# Patient Record
Sex: Male | Born: 1960
Health system: Southern US, Community
[De-identification: ages and names within clinical notes are randomized; demographics above are authoritative.]

---

## 2017-05-15 ENCOUNTER — Inpatient Hospital Stay
Admission: RE | Admit: 2017-05-15 | Discharge: 2017-06-24 | Disposition: A | Discharge status: Home / Self Care | Payer: Medicaid Other | Source: Home / Self Care | Attending: Internal Medicine | Admitting: Internal Medicine

## 2017-05-15 DIAGNOSIS — Z09 Encounter for follow-up examination after completed treatment for conditions other than malignant neoplasm: Secondary | ICD-10-CM

## 2017-05-15 DIAGNOSIS — Z0189 Encounter for other specified special examinations: Secondary | ICD-10-CM

## 2017-05-15 DIAGNOSIS — J189 Pneumonia, unspecified organism: Secondary | ICD-10-CM

## 2017-05-15 DIAGNOSIS — Z992 Dependence on renal dialysis: Secondary | ICD-10-CM

## 2017-05-15 DIAGNOSIS — Z978 Presence of other specified devices: Secondary | ICD-10-CM

## 2017-05-15 DIAGNOSIS — J9690 Respiratory failure, unspecified, unspecified whether with hypoxia or hypercapnia: Secondary | ICD-10-CM

## 2017-05-16 ENCOUNTER — Other Ambulatory Visit (HOSPITAL_COMMUNITY): Payer: Medicaid Other

## 2017-05-16 LAB — COMPREHENSIVE METABOLIC PANEL
ALBUMIN: 1.2 g/dL — AB (ref 3.5–5.0)
ALK PHOS: 501 U/L — AB (ref 38–126)
ALK PHOS: 950 U/L — AB (ref 38–126)
ALT: 100 U/L — AB (ref 17–63)
ALT: 216 U/L — ABNORMAL HIGH (ref 17–63)
ANION GAP: 13 (ref 5–15)
AST: 189 U/L — AB (ref 15–41)
AST: 691 U/L — ABNORMAL HIGH (ref 15–41)
Albumin: 1.2 g/dL — ABNORMAL LOW (ref 3.5–5.0)
Anion gap: 13 (ref 5–15)
BILIRUBIN TOTAL: 1.5 mg/dL — AB (ref 0.3–1.2)
BUN: 68 mg/dL — AB (ref 6–20)
BUN: 76 mg/dL — ABNORMAL HIGH (ref 6–20)
CALCIUM: 7.5 mg/dL — AB (ref 8.9–10.3)
CO2: 25 mmol/L (ref 22–32)
CO2: 25 mmol/L (ref 22–32)
CREATININE: 4.51 mg/dL — AB (ref 0.61–1.24)
Calcium: 7.5 mg/dL — ABNORMAL LOW (ref 8.9–10.3)
Chloride: 97 mmol/L — ABNORMAL LOW (ref 101–111)
Chloride: 97 mmol/L — ABNORMAL LOW (ref 101–111)
Creatinine, Ser: 5.41 mg/dL — ABNORMAL HIGH (ref 0.61–1.24)
GFR calc non Af Amer: 14 mL/min — ABNORMAL LOW (ref >60)
GFR calc non Af Amer: 11 mL/min — ABNORMAL LOW (ref >60)
GFR, EST AFRICAN AMERICAN: 13 mL/min — AB (ref >60)
GFR, EST AFRICAN AMERICAN: 16 mL/min — AB (ref >60)
GLUCOSE: 64 mg/dL — AB (ref 65–99)
Glucose, Bld: 294 mg/dL — ABNORMAL HIGH (ref 65–99)
Potassium: 3.9 mmol/L (ref 3.5–5.1)
Potassium: 4.2 mmol/L (ref 3.5–5.1)
SODIUM: 135 mmol/L (ref 135–145)
SODIUM: 135 mmol/L (ref 135–145)
TOTAL PROTEIN: 6 g/dL — AB (ref 6.5–8.1)
Total Bilirubin: 1 mg/dL (ref 0.3–1.2)
Total Protein: 6 g/dL — ABNORMAL LOW (ref 6.5–8.1)

## 2017-05-16 LAB — CBC WITH DIFFERENTIAL/PLATELET
BASOS PCT: 0 %
Basophils Absolute: 0 10*3/uL (ref 0.0–0.1)
EOS ABS: 0 10*3/uL (ref 0.0–0.7)
EOS PCT: 0 %
HEMATOCRIT: 24.1 % — AB (ref 39.0–52.0)
Hemoglobin: 7.8 g/dL — ABNORMAL LOW (ref 13.0–17.0)
LYMPHS ABS: 1.6 10*3/uL (ref 0.7–4.0)
Lymphocytes Relative: 8 %
MCH: 28.6 pg (ref 26.0–34.0)
MCHC: 32.4 g/dL (ref 30.0–36.0)
MCV: 88.3 fL (ref 78.0–100.0)
MONO ABS: 1 10*3/uL (ref 0.1–1.0)
Monocytes Relative: 5 %
NEUTROS ABS: 17.5 10*3/uL — AB (ref 1.7–7.7)
NEUTROS PCT: 87 %
PLATELETS: 65 10*3/uL — AB (ref 150–400)
RBC: 2.73 MIL/uL — ABNORMAL LOW (ref 4.22–5.81)
RDW: 14.3 % (ref 11.5–15.5)
WBC: 20.1 10*3/uL — ABNORMAL HIGH (ref 4.0–10.5)

## 2017-05-16 LAB — RENAL FUNCTION PANEL
ALBUMIN: 1.1 g/dL — AB (ref 3.5–5.0)
ANION GAP: 15 (ref 5–15)
BUN: 78 mg/dL — ABNORMAL HIGH (ref 6–20)
CALCIUM: 7.5 mg/dL — AB (ref 8.9–10.3)
CO2: 22 mmol/L (ref 22–32)
Chloride: 98 mmol/L — ABNORMAL LOW (ref 101–111)
Creatinine, Ser: 5.2 mg/dL — ABNORMAL HIGH (ref 0.61–1.24)
GFR, EST AFRICAN AMERICAN: 13 mL/min — AB (ref >60)
GFR, EST NON AFRICAN AMERICAN: 11 mL/min — AB (ref >60)
GLUCOSE: 152 mg/dL — AB (ref 65–99)
PHOSPHORUS: 5.9 mg/dL — AB (ref 2.5–4.6)
Potassium: 5.3 mmol/L — ABNORMAL HIGH (ref 3.5–5.1)
SODIUM: 135 mmol/L (ref 135–145)

## 2017-05-16 LAB — BLOOD GAS, ARTERIAL
ACID-BASE EXCESS: 5.3 mmol/L — AB (ref 0.0–2.0)
Acid-Base Excess: 3.9 mmol/L — ABNORMAL HIGH (ref 0.0–2.0)
Bicarbonate: 28.4 mmol/L — ABNORMAL HIGH (ref 20.0–28.0)
Bicarbonate: 27.6 mmol/L (ref 20.0–28.0)
FIO2: 28
FIO2: 40
MECHVT: 450 mL
O2 SAT: 92.7 %
O2 Saturation: 90.9 %
PATIENT TEMPERATURE: 98.6
PCO2 ART: 35.3 mmHg (ref 32.0–48.0)
PEEP/CPAP: 5 cmH2O
PEEP: 5 cmH2O
PH ART: 7.516 — AB (ref 7.350–7.450)
PO2 ART: 62.6 mmHg — AB (ref 83.0–108.0)
Patient temperature: 98.6
RATE: 10 resp/min
RATE: 10 resp/min
VT: 450 mL
pCO2 arterial: 39.1 mmHg (ref 32.0–48.0)
pH, Arterial: 7.462 — ABNORMAL HIGH (ref 7.350–7.450)
pO2, Arterial: 63.6 mmHg — ABNORMAL LOW (ref 83.0–108.0)

## 2017-05-16 LAB — CBC
HCT: 24.1 % — ABNORMAL LOW (ref 39.0–52.0)
HCT: 25.6 % — ABNORMAL LOW (ref 39.0–52.0)
HEMOGLOBIN: 7.9 g/dL — AB (ref 13.0–17.0)
HEMOGLOBIN: 8.4 g/dL — AB (ref 13.0–17.0)
MCH: 28.9 pg (ref 26.0–34.0)
MCH: 29.5 pg (ref 26.0–34.0)
MCHC: 32.8 g/dL (ref 30.0–36.0)
MCHC: 32.8 g/dL (ref 30.0–36.0)
MCV: 88.3 fL (ref 78.0–100.0)
MCV: 89.8 fL (ref 78.0–100.0)
Platelets: 86 10*3/uL — ABNORMAL LOW (ref 150–400)
Platelets: 129 10*3/uL — ABNORMAL LOW (ref 150–400)
RBC: 2.73 MIL/uL — AB (ref 4.22–5.81)
RBC: 2.85 MIL/uL — ABNORMAL LOW (ref 4.22–5.81)
RDW: 14.5 % (ref 11.5–15.5)
RDW: 14.7 % (ref 11.5–15.5)
WBC: 23.8 10*3/uL — AB (ref 4.0–10.5)
WBC: 25.5 10*3/uL — ABNORMAL HIGH (ref 4.0–10.5)

## 2017-05-16 LAB — HEMOGLOBIN A1C
Hgb A1c MFr Bld: 7.3 % — ABNORMAL HIGH (ref 4.8–5.6)
MEAN PLASMA GLUCOSE: 162.81 mg/dL

## 2017-05-16 LAB — TSH
TSH: 5.675 u[IU]/mL — ABNORMAL HIGH (ref 0.350–4.500)
TSH: 4.201 u[IU]/mL (ref 0.350–4.500)

## 2017-05-16 NOTE — Consult Note (Signed)
[facility] CONSULT NOTE    Date: 05/16/2017                  Patient Name:  Nathan Branch  MRN: [id number]  DOB: [birth year]  Age / Sex: 57 y.o., male         PCP: No primary care provider on file.                 Service Requesting Consult: Hospitalist                 Reason for Consult: Acute renal failure requiring dialysis            History of Present Illness: Patient is a 57 y.o. male with a PMHx of diabetes mellitus type 2, schizophrenia, bipolar disorder, hyperlipidemia, hypertension, who was admitted to [facility] on 05/15/2017 for ongoing treatment of acute respiratory failure secondary to bilateral pneumonia as well as acute renal failure requiring hemodialysis.  She was admitted to outside hospital from May 09, 2017 to May 15, 2017.  Originally he came with a witnessed cardiopulmonary arrest.  The cause of this appeared to be community-acquired pneumonia leading to acute respiratory failure.  Patient was initially admitted to ICU at the outside hospital.  He also had evidence of septic shock.  He was initially weaned from the ventilator yesterday but had to be placed back on.  In addition his renal function deteriorated and they started him on hemodialysis.  His renal function remains quite poor with a BUN of 68 and creatinine of 4.51.  She will has an elevated white count of 20.1.   Medications:  Medications upon discharge from outside hospital: Meropenem 1 g IV every 24 hours, valproic acid 250 mg twice daily, Lantus 15 units nightly, Haldol 2 mg intramuscular nightly, Pepcid 20 mg daily, Levophed drip.   Allergies: Aspirin   Past Medical History: diabetes mellitus type 2, schizophrenia, bipolar disorder, hyperlipidemia, hypertension,  Past Surgical History: Right femoral temporary dialysis catheter placement  Family History: Unable to obtain from the patient as he is on the ventilator.  Social History: Unable to obtain from  the patient as he is on the ventilator.  Review of Systems: Unable to obtain from the patient as he is on the ventilator.  Vital Signs: Pulse 93 respirations 24 blood pressure 101/57 Weight trends: There were no vitals filed for this visit.  Physical Exam: General: Critically ill appearing  Head: Normocephalic, atraumatic.  Eyes: Anicteric  Nose: Mucous membranes moist, not inflammed, nonerythematous.  Throat: ETT and OG tube in place  Neck: Supple, trachea midline.  Lungs:  Coarse rhonchi bilateral, vent assisted  Heart: S1S2 no rubs  Abdomen:  BS normoactive. Soft, Nondistended, non-tender.  No masses or organomegaly.  Extremities: No pretibial edema.  Neurologic: Arousable, hands in restraints  Skin: No visible rashes, scars.    Lab results: Basic Metabolic Panel: Recent Labs  Lab 05/16/17 0012  NA 135  K 3.9  CL 97*  CO2 25  GLUCOSE 64*  BUN 68*  CREATININE 4.51*  CALCIUM 7.5*    Liver Function Tests: Recent Labs  Lab 05/16/17 0012  AST 189*  ALT 100*  ALKPHOS 501*  BILITOT 1.0  PROT 6.0*  ALBUMIN 1.2*   No results for input(s): LIPASE, AMYLASE in the last 168 hours. No results for input(s): AMMONIA in the last 168 hours.  CBC: Recent Labs  Lab 05/16/17 0012  WBC 20.1*  NEUTROABS 17.5*  HGB 7.8*  HCT  24.1*  MCV 88.3  PLT 65*    Cardiac Enzymes: No results for input(s): CKTOTAL, CKMB, CKMBINDEX, TROPONINI in the last 168 hours.  BNP: Invalid input(s): POCBNP  CBG: No results for input(s): GLUCAP in the last 168 hours.  Microbiology: No results found for this or any previous visit.  Coagulation Studies: No results for input(s): LABPROT, INR in the last 72 hours.  Urinalysis: No results for input(s): COLORURINE, LABSPEC, PHURINE, GLUCOSEU, HGBUR, BILIRUBINUR, KETONESUR, PROTEINUR, UROBILINOGEN, NITRITE, LEUKOCYTESUR in the last 72 hours.  Invalid input(s): APPERANCEUR    Imaging: Dg Abd Portable 1v  Result Date:  05/16/2017 CLINICAL DATA:  Initial evaluation for nasogastric tube placement. EXAM: PORTABLE ABDOMEN - 1 VIEW COMPARISON:  None. FINDINGS: NG tube in place with tip in side hole overlying the stomach, well beyond the GE junction. Tip projects distally. Bowel gas pattern within normal limits without obstruction or ileus. Right-sided femoral catheter overlies the lower right pelvis. Dense retrocardiac left lower lobe opacity, which may reflect atelectasis or infiltrate. Suspected small left pleural effusion. IMPRESSION: 1. NG tube in place with tip and side hole well beyond the GE junction. Tip projects distally. 2. Dense retrocardiac left lower lobe opacity, which may reflect atelectasis or infiltrate. 3. Small left pleural effusion. Electronically Signed   By: Rise Mu M.D.   On: 05/16/2017 03:00      Assessment & Plan: Pt is a 57 y.o. male with a PMHx of diabetes mellitus type 2, schizophrenia, bipolar disorder, hyperlipidemia, hypertension, who was admitted to [facility] on 05/15/2017 for ongoing treatment of acute respiratory failure secondary to bilateral pneumonia as well as acute renal failure requiring hemodialysis.  She had witnessed cardiopulmonary arrest prior to being brought to outside hospital.  1.  Acute renal failure secondary to septic shock. 2.  Acute respiratory failure currently on the ventilator. 3.  Anemia unspecified. 4.  Hypotension.  Plan: Patient was originally admitted to outside hospital with acute cardiopulmonary arrest.  Hospital course complicated with acute respiratory failure, bilateral pneumonia, and acute renal failure.  His BUN and creatinine remain quite high.  He has a temporary femoral dialysis catheter in place.  Therefore we will proceed with dialysis treatment again today.  We will plan for treatment for 3.5 hours, blood flow rate 400, dialysate flow rate 800, and minimal ultrafiltration.  Will provide for albumin during dialysis for blood  pressure support.  Prognosis guarded at the moment.  Further plan as patient progresses.  Thanks for consultation.

## 2017-05-17 LAB — HEPATITIS B SURFACE ANTIGEN: HEP B S AG: NEGATIVE

## 2017-05-17 LAB — HEPATITIS B CORE ANTIBODY, TOTAL: HEP B C TOTAL AB: NEGATIVE

## 2017-05-17 LAB — C DIFFICILE QUICK SCREEN W PCR REFLEX
C DIFFICLE (CDIFF) ANTIGEN: NEGATIVE
C Diff interpretation: NOT DETECTED
C Diff toxin: NEGATIVE

## 2017-05-17 LAB — HEPATITIS B SURFACE ANTIBODY,QUALITATIVE: HEP B S AB: NONREACTIVE

## 2017-05-19 LAB — RENAL FUNCTION PANEL
ALBUMIN: 1.1 g/dL — AB (ref 3.5–5.0)
Anion gap: 12 (ref 5–15)
BUN: 76 mg/dL — AB (ref 6–20)
CALCIUM: 7.7 mg/dL — AB (ref 8.9–10.3)
CO2: 25 mmol/L (ref 22–32)
CREATININE: 6.29 mg/dL — AB (ref 0.61–1.24)
Chloride: 97 mmol/L — ABNORMAL LOW (ref 101–111)
GFR calc Af Amer: 10 mL/min — ABNORMAL LOW (ref >60)
GFR calc non Af Amer: 9 mL/min — ABNORMAL LOW (ref >60)
GLUCOSE: 187 mg/dL — AB (ref 65–99)
PHOSPHORUS: 6.9 mg/dL — AB (ref 2.5–4.6)
Potassium: 4.1 mmol/L (ref 3.5–5.1)
SODIUM: 134 mmol/L — AB (ref 135–145)

## 2017-05-19 LAB — CBC
HCT: 22.6 % — ABNORMAL LOW (ref 39.0–52.0)
Hemoglobin: 7.4 g/dL — ABNORMAL LOW (ref 13.0–17.0)
MCH: 29.6 pg (ref 26.0–34.0)
MCHC: 32.7 g/dL (ref 30.0–36.0)
MCV: 90.4 fL (ref 78.0–100.0)
Platelets: 344 10*3/uL (ref 150–400)
RBC: 2.5 MIL/uL — ABNORMAL LOW (ref 4.22–5.81)
RDW: 14.9 % (ref 11.5–15.5)
WBC: 16.3 10*3/uL — AB (ref 4.0–10.5)

## 2017-05-19 NOTE — Progress Notes (Signed)
[facility]  ROUNDING NOTE   Subjective:  Patient seen and evaluated during hemodialysis. He remains critically ill and remains on the ventilator. Overall his renal function remains low with a creatinine of 6.29. He also appears to be anemic with a hemoglobin of 7.4.   Objective:  Vital signs in last 24 hours:  Temperature 98.7 pulse 107 respirations 18 blood pressure 120/66 Physical Exam: General: Critically ill appearing  Head: Normocephalic, atraumatic. Moist oral mucosal membranes  Eyes: Anicteric  Neck: Supple, trachea midline  Lungs:  Scattered rhonchi, on ventilator  Heart: S1S2 no rubs  Abdomen:  Soft, nontender, bowel sounds present  Extremities: Trace peripheral edema.  Neurologic: Not following commands  Skin: No lesions  Access: Right femoral dialysis catheter    Basic Metabolic Panel: Recent Labs  Lab 05/16/17 0012 05/16/17 0907 05/16/17 1324 05/19/17 0757  NA 135 135 135 134*  K 3.9 5.3* 4.2 4.1  CL 97* 98* 97* 97*  CO2 25 22 25 25   GLUCOSE 64* 152* 294* 187*  BUN 68* 78* 76* 76*  CREATININE 4.51* 5.20* 5.41* 6.29*  CALCIUM 7.5* 7.5* 7.5* 7.7*  PHOS  --  5.9*  --  6.9*    Liver Function Tests: Recent Labs  Lab 05/16/17 0012 05/16/17 0907 05/16/17 1324 05/19/17 0757  AST 189*  --  691*  --   ALT 100*  --  216*  --   ALKPHOS 501*  --  950*  --   BILITOT 1.0  --  1.5*  --   PROT 6.0*  --  6.0*  --   ALBUMIN 1.2* 1.1* 1.2* 1.1*   No results for input(s): LIPASE, AMYLASE in the last 168 hours. No results for input(s): AMMONIA in the last 168 hours.  CBC: Recent Labs  Lab 05/16/17 0012 05/16/17 0907 05/16/17 1324 05/19/17 0757  WBC 20.1* 23.8* 25.5* 16.3*  NEUTROABS 17.5*  --   --   --   HGB 7.8* 7.9* 8.4* 7.4*  HCT 24.1* 24.1* 25.6* 22.6*  MCV 88.3 88.3 89.8 90.4  PLT 65* 86* 129* 344    Cardiac Enzymes: No results for input(s): CKTOTAL, CKMB, CKMBINDEX, TROPONINI in the last 168 hours.  BNP: Invalid input(s):  POCBNP  CBG: No results for input(s): GLUCAP in the last 168 hours.  Microbiology: Results for orders placed or performed during the hospital encounter of 05/15/17  C difficile quick scan w PCR reflex     Status: None   Collection Time: 05/17/17 12:30 PM  Result Value Ref Range Status   C Diff antigen NEGATIVE NEGATIVE Final   C Diff toxin NEGATIVE NEGATIVE Final   C Diff interpretation No C. difficile detected.  Final    Coagulation Studies: No results for input(s): LABPROT, INR in the last 72 hours.  Urinalysis: No results for input(s): COLORURINE, LABSPEC, PHURINE, GLUCOSEU, HGBUR, BILIRUBINUR, KETONESUR, PROTEINUR, UROBILINOGEN, NITRITE, LEUKOCYTESUR in the last 72 hours.  Invalid input(s): APPERANCEUR    Imaging: No results found.   Medications:       Assessment/ Plan:  57 y.o. male with a PMHx of diabetes mellitus type 2, schizophrenia, bipolar disorder, hyperlipidemia, hypertension, who was admitted to [facility] on 05/15/2017 for ongoing treatment of acute respiratory failure secondary to bilateral pneumonia as well as acute renal failure requiring hemodialysis.  He had witnessed cardiopulmonary arrest prior to being brought to outside hospital.  1.  Acute renal failure secondary to septic shock. 2.  Acute respiratory failure currently on the ventilator. 3.  Anemia unspecified. 4.  Hypotension.  Plan:  Patient remains critically ill at this point in time.  He is seen and evaluated during hemodialysis today and appears to be tolerating well thus far.  Patient's acute respiratory failure is ongoing and he remains on the ventilator at this time.  He will need continue ventilatory support at this time.  Hemoglobin also remains low.  Consider infusion for hemoglobin of 7 or less.  Overall patient has a very guarded prognosis and we will continue to monitor his progress closely.   LOS: 0 Darcy Barbara 1/28/20193:38 PM

## 2017-05-21 LAB — RENAL FUNCTION PANEL
Albumin: 1.3 g/dL — ABNORMAL LOW (ref 3.5–5.0)
Anion gap: 11 (ref 5–15)
BUN: 53 mg/dL — ABNORMAL HIGH (ref 6–20)
CO2: 25 mmol/L (ref 22–32)
Calcium: 7.7 mg/dL — ABNORMAL LOW (ref 8.9–10.3)
Chloride: 99 mmol/L — ABNORMAL LOW (ref 101–111)
Creatinine, Ser: 5.64 mg/dL — ABNORMAL HIGH (ref 0.61–1.24)
GFR calc Af Amer: 12 mL/min — ABNORMAL LOW
GFR calc non Af Amer: 10 mL/min — ABNORMAL LOW
Glucose, Bld: 122 mg/dL — ABNORMAL HIGH (ref 65–99)
Phosphorus: 6.4 mg/dL — ABNORMAL HIGH (ref 2.5–4.6)
Potassium: 4 mmol/L (ref 3.5–5.1)
Sodium: 135 mmol/L (ref 135–145)

## 2017-05-21 LAB — CBC
HCT: 18.7 % — ABNORMAL LOW (ref 39.0–52.0)
Hemoglobin: 6 g/dL — CL (ref 13.0–17.0)
MCH: 29.4 pg (ref 26.0–34.0)
MCHC: 32.1 g/dL (ref 30.0–36.0)
MCV: 91.7 fL (ref 78.0–100.0)
Platelets: 454 10*3/uL — ABNORMAL HIGH (ref 150–400)
RBC: 2.04 MIL/uL — ABNORMAL LOW (ref 4.22–5.81)
RDW: 15.2 % (ref 11.5–15.5)
WBC: 12.4 10*3/uL — ABNORMAL HIGH (ref 4.0–10.5)

## 2017-05-21 LAB — PREPARE RBC (CROSSMATCH)

## 2017-05-21 NOTE — Progress Notes (Signed)
[facility]  ROUNDING NOTE   Subjective:  Patient remains critically ill but is much more awake and alert today.\ Patient due for hemodialysis today. Hemoglobin quite low at 6.0.  Objective:  Vital signs in last 24 hours:  Temperature 98.7 pulse 107 respiration 17 blood pressure 105/59  Physical Exam: General: Critically ill appearing  Head: Normocephalic, atraumatic. Moist oral mucosal membranes  Eyes: Anicteric  Neck: Supple, trachea midline  Lungs:  Scattered rhonchi, on ventilator  Heart: S1S2 no rubs  Abdomen:  Soft, nontender, bowel sounds present  Extremities: Trace peripheral edema.  Neurologic:  Arousable  Skin: No lesions  Access: Right femoral dialysis catheter    Basic Metabolic Panel: Recent Labs  Lab 05/16/17 0012 05/16/17 0907 05/16/17 1324 05/19/17 0757 05/21/17 0539  NA 135 135 135 134* 135  K 3.9 5.3* 4.2 4.1 4.0  CL 97* 98* 97* 97* 99*  CO2 25 22 25 25 25   GLUCOSE 64* 152* 294* 187* 122*  BUN 68* 78* 76* 76* 53*  CREATININE 4.51* 5.20* 5.41* 6.29* 5.64*  CALCIUM 7.5* 7.5* 7.5* 7.7* 7.7*  PHOS  --  5.9*  --  6.9* 6.4*    Liver Function Tests: Recent Labs  Lab 05/16/17 0012 05/16/17 0907 05/16/17 1324 05/19/17 0757 05/21/17 0539  AST 189*  --  691*  --   --   ALT 100*  --  216*  --   --   ALKPHOS 501*  --  950*  --   --   BILITOT 1.0  --  1.5*  --   --   PROT 6.0*  --  6.0*  --   --   ALBUMIN 1.2* 1.1* 1.2* 1.1* 1.3*   No results for input(s): LIPASE, AMYLASE in the last 168 hours. No results for input(s): AMMONIA in the last 168 hours.  CBC: Recent Labs  Lab 05/16/17 0012 05/16/17 0907 05/16/17 1324 05/19/17 0757 05/21/17 0539  WBC 20.1* 23.8* 25.5* 16.3* 12.4*  NEUTROABS 17.5*  --   --   --   --   HGB 7.8* 7.9* 8.4* 7.4* 6.0*  HCT 24.1* 24.1* 25.6* 22.6* 18.7*  MCV 88.3 88.3 89.8 90.4 91.7  PLT 65* 86* 129* 344 454*    Cardiac Enzymes: No results for input(s): CKTOTAL, CKMB, CKMBINDEX, TROPONINI in the  last 168 hours.  BNP: Invalid input(s): POCBNP  CBG: No results for input(s): GLUCAP in the last 168 hours.  Microbiology: Results for orders placed or performed during the hospital encounter of 05/15/17  C difficile quick scan w PCR reflex     Status: None   Collection Time: 05/17/17 12:30 PM  Result Value Ref Range Status   C Diff antigen NEGATIVE NEGATIVE Final   C Diff toxin NEGATIVE NEGATIVE Final   C Diff interpretation No C. difficile detected.  Final    Coagulation Studies: No results for input(s): LABPROT, INR in the last 72 hours.  Urinalysis: No results for input(s): COLORURINE, LABSPEC, PHURINE, GLUCOSEU, HGBUR, BILIRUBINUR, KETONESUR, PROTEINUR, UROBILINOGEN, NITRITE, LEUKOCYTESUR in the last 72 hours.  Invalid input(s): APPERANCEUR    Imaging: No results found.   Medications:       Assessment/ Plan:  57 y.o. male with a PMHx of diabetes mellitus type 2, schizophrenia, bipolar disorder, hyperlipidemia, hypertension, who was admitted to [facility] on 05/15/2017 for ongoing treatment of acute respiratory failure secondary to bilateral pneumonia as well as acute renal failure requiring hemodialysis.  He had witnessed cardiopulmonary arrest prior to being brought to  outside hospital.  1.  Acute renal failure secondary to septic shock. 2.  Acute respiratory failure currently on the ventilator. 3.  Anemia unspecified. 4.  Hypotension.  Plan:  Patient seen at bedside.  Still remains critically ill overall.  Creatinine remains high at 5.64.  Therefore we will continue hemodialysis services.  Hemoglobin quite low at 6.0.  Recommend blood transfusion today but defer this to hospitalist.  Prognosis remains guarded.  We will continue to monitor his status.   LOS: 0 Haylee Mcanany 1/30/20194:24 PM

## 2017-05-22 LAB — CBC
HEMATOCRIT: 20.4 % — AB (ref 39.0–52.0)
Hemoglobin: 6.8 g/dL — CL (ref 13.0–17.0)
MCH: 30 pg (ref 26.0–34.0)
MCHC: 33.3 g/dL (ref 30.0–36.0)
MCV: 89.9 fL (ref 78.0–100.0)
Platelets: 467 10*3/uL — ABNORMAL HIGH (ref 150–400)
RBC: 2.27 MIL/uL — ABNORMAL LOW (ref 4.22–5.81)
RDW: 15.8 % — ABNORMAL HIGH (ref 11.5–15.5)
WBC: 11.6 10*3/uL — ABNORMAL HIGH (ref 4.0–10.5)

## 2017-05-23 LAB — CBC
HEMATOCRIT: 22.9 % — AB (ref 39.0–52.0)
Hemoglobin: 7.3 g/dL — ABNORMAL LOW (ref 13.0–17.0)
MCH: 29 pg (ref 26.0–34.0)
MCHC: 31.9 g/dL (ref 30.0–36.0)
MCV: 90.9 fL (ref 78.0–100.0)
Platelets: 533 10*3/uL — ABNORMAL HIGH (ref 150–400)
RBC: 2.52 MIL/uL — AB (ref 4.22–5.81)
RDW: 15.7 % — ABNORMAL HIGH (ref 11.5–15.5)
WBC: 10.3 10*3/uL (ref 4.0–10.5)

## 2017-05-23 LAB — RENAL FUNCTION PANEL
Albumin: 1.2 g/dL — ABNORMAL LOW (ref 3.5–5.0)
Anion gap: 9 (ref 5–15)
BUN: 46 mg/dL — AB (ref 6–20)
CHLORIDE: 97 mmol/L — AB (ref 101–111)
CO2: 28 mmol/L (ref 22–32)
Calcium: 7.9 mg/dL — ABNORMAL LOW (ref 8.9–10.3)
Creatinine, Ser: 5.38 mg/dL — ABNORMAL HIGH (ref 0.61–1.24)
GFR, EST AFRICAN AMERICAN: 13 mL/min — AB (ref >60)
GFR, EST NON AFRICAN AMERICAN: 11 mL/min — AB (ref >60)
Glucose, Bld: 97 mg/dL (ref 65–99)
POTASSIUM: 3.9 mmol/L (ref 3.5–5.1)
Phosphorus: 6.5 mg/dL — ABNORMAL HIGH (ref 2.5–4.6)
Sodium: 134 mmol/L — ABNORMAL LOW (ref 135–145)

## 2017-05-23 LAB — BLOOD GAS, ARTERIAL
ACID-BASE EXCESS: 5.7 mmol/L — AB (ref 0.0–2.0)
BICARBONATE: 29.5 mmol/L — AB (ref 20.0–28.0)
FIO2: 28
O2 SAT: 98.2 %
PATIENT TEMPERATURE: 98.6
PCO2 ART: 41.6 mmHg (ref 32.0–48.0)
PEEP: 5 cmH2O
PH ART: 7.465 — AB (ref 7.350–7.450)
Pressure support: 12 cmH2O
pO2, Arterial: 103 mmHg (ref 83.0–108.0)

## 2017-05-23 LAB — PREPARE RBC (CROSSMATCH)

## 2017-05-23 NOTE — Progress Notes (Signed)
[facility]  ROUNDING NOTE   Subjective:  Patient seen and evaluated at bedside. He will be due for hemodialysis again today. He remains anemic with hemoglobin of 6.8.  Objective:  Vital signs in last 24 hours:  Temperature 97.2 pulse 95 respirations 18 blood pressure 136/72  Physical Exam: General: Critically ill appearing  Head: ETT/OG in place  Eyes: Anicteric  Neck: Supple, trachea midline  Lungs:  Scattered rhonchi, on ventilator  Heart: S1S2 no rubs  Abdomen:  Soft, nontender, bowel sounds present  Extremities: Trace peripheral edema.  Neurologic: Arousable  Skin: No lesions  Access: Right femoral dialysis catheter    Basic Metabolic Panel: Recent Labs  Lab 05/16/17 0907 05/16/17 1324 05/19/17 0757 05/21/17 0539  NA 135 135 134* 135  K 5.3* 4.2 4.1 4.0  CL 98* 97* 97* 99*  CO2 22 25 25 25   GLUCOSE 152* 294* 187* 122*  BUN 78* 76* 76* 53*  CREATININE 5.20* 5.41* 6.29* 5.64*  CALCIUM 7.5* 7.5* 7.7* 7.7*  PHOS 5.9*  --  6.9* 6.4*    Liver Function Tests: Recent Labs  Lab 05/16/17 0907 05/16/17 1324 05/19/17 0757 05/21/17 0539  AST  --  691*  --   --   ALT  --  216*  --   --   ALKPHOS  --  950*  --   --   BILITOT  --  1.5*  --   --   PROT  --  6.0*  --   --   ALBUMIN 1.1* 1.2* 1.1* 1.3*   No results for input(s): LIPASE, AMYLASE in the last 168 hours. No results for input(s): AMMONIA in the last 168 hours.  CBC: Recent Labs  Lab 05/16/17 0907 05/16/17 1324 05/19/17 0757 05/21/17 0539 05/22/17 1148  WBC 23.8* 25.5* 16.3* 12.4* 11.6*  HGB 7.9* 8.4* 7.4* 6.0* 6.8*  HCT 24.1* 25.6* 22.6* 18.7* 20.4*  MCV 88.3 89.8 90.4 91.7 89.9  PLT 86* 129* 344 454* 467*    Cardiac Enzymes: No results for input(s): CKTOTAL, CKMB, CKMBINDEX, TROPONINI in the last 168 hours.  BNP: Invalid input(s): POCBNP  CBG: No results for input(s): GLUCAP in the last 168 hours.  Microbiology: Results for orders placed or performed during the  hospital encounter of 05/15/17  C difficile quick scan w PCR reflex     Status: None   Collection Time: 05/17/17 12:30 PM  Result Value Ref Range Status   C Diff antigen NEGATIVE NEGATIVE Final   C Diff toxin NEGATIVE NEGATIVE Final   C Diff interpretation No C. difficile detected.  Final    Coagulation Studies: No results for input(s): LABPROT, INR in the last 72 hours.  Urinalysis: No results for input(s): COLORURINE, LABSPEC, PHURINE, GLUCOSEU, HGBUR, BILIRUBINUR, KETONESUR, PROTEINUR, UROBILINOGEN, NITRITE, LEUKOCYTESUR in the last 72 hours.  Invalid input(s): APPERANCEUR    Imaging: No results found.   Medications:       Assessment/ Plan:  57 y.o. male with a PMHx of diabetes mellitus type 2, schizophrenia, bipolar disorder, hyperlipidemia, hypertension, who was admitted to [facility] on 05/15/2017 for ongoing treatment of acute respiratory failure secondary to bilateral pneumonia as well as acute renal failure requiring hemodialysis.  He had witnessed cardiopulmonary arrest prior to being brought to outside hospital.  1.  Acute renal failure secondary to septic shock. 2.  Acute respiratory failure currently on the ventilator. 3.  Anemia unspecified. 4.  Hypotension.  Plan:  Critical illness persist.  He continues to have respiratory failure  and has failed multiple weaning trials.  He is being considered for tracheostomy.  Renal function also remains quite low with an EGFR of 12.  We will plan for hemodialysis again today.  If renal failure persist we would likely need to place a PermCath in the relative near future as well.  Hemoglobin currently 6.8.  Recommend blood transfusion but defer this to the hospitalist.  Overall prognosis remains guarded.  LOS: 0 Nathan Branch 2/1/20198:41 AM

## 2017-05-24 ENCOUNTER — Other Ambulatory Visit (HOSPITAL_COMMUNITY): Payer: Medicaid Other

## 2017-05-25 ENCOUNTER — Other Ambulatory Visit (HOSPITAL_COMMUNITY): Payer: Medicaid Other

## 2017-05-25 LAB — TYPE AND SCREEN
ABO/RH(D): A POS
ANTIBODY SCREEN: NEGATIVE
DAT, IgG: NEGATIVE
PT AG TYPE: POSITIVE
UNIT DIVISION: 0
UNIT DIVISION: 0
UNIT DIVISION: 0

## 2017-05-25 LAB — BPAM RBC
BLOOD PRODUCT EXPIRATION DATE: 201902172359
Blood Product Expiration Date: 201902152359
Blood Product Expiration Date: 201902162359
ISSUE DATE / TIME: 201901302237
ISSUE DATE / TIME: 201901211813
ISSUE DATE / TIME: 201901211813
UNIT TYPE AND RH: 5100
UNIT TYPE AND RH: 5100
Unit Type and Rh: 5100

## 2017-05-26 ENCOUNTER — Other Ambulatory Visit (HOSPITAL_COMMUNITY): Payer: Medicaid Other

## 2017-05-26 LAB — RENAL FUNCTION PANEL
ANION GAP: 12 (ref 5–15)
Albumin: 1.5 g/dL — ABNORMAL LOW (ref 3.5–5.0)
BUN: 54 mg/dL — ABNORMAL HIGH (ref 6–20)
CALCIUM: 8.3 mg/dL — AB (ref 8.9–10.3)
CO2: 26 mmol/L (ref 22–32)
Chloride: 95 mmol/L — ABNORMAL LOW (ref 101–111)
Creatinine, Ser: 6.6 mg/dL — ABNORMAL HIGH (ref 0.61–1.24)
GFR calc non Af Amer: 9 mL/min — ABNORMAL LOW (ref >60)
GFR, EST AFRICAN AMERICAN: 10 mL/min — AB (ref >60)
GLUCOSE: 72 mg/dL (ref 65–99)
Phosphorus: 8 mg/dL — ABNORMAL HIGH (ref 2.5–4.6)
Potassium: 3.4 mmol/L — ABNORMAL LOW (ref 3.5–5.1)
SODIUM: 133 mmol/L — AB (ref 135–145)

## 2017-05-26 LAB — CBC
HCT: 24.6 % — ABNORMAL LOW (ref 39.0–52.0)
HEMOGLOBIN: 7.9 g/dL — AB (ref 13.0–17.0)
MCH: 29.8 pg (ref 26.0–34.0)
MCHC: 32.1 g/dL (ref 30.0–36.0)
MCV: 92.8 fL (ref 78.0–100.0)
PLATELETS: 474 10*3/uL — AB (ref 150–400)
RBC: 2.65 MIL/uL — AB (ref 4.22–5.81)
RDW: 15.7 % — ABNORMAL HIGH (ref 11.5–15.5)
WBC: 8.2 10*3/uL (ref 4.0–10.5)

## 2017-05-26 NOTE — Progress Notes (Signed)
[facility]  ROUNDING NOTE   Subjective:  Patient seen and evaluated during hemodialysis blood flow rate 400. Appears to be tolerating dialysis well.  Objective:  Vital signs in last 24 hours:  Pulse 101 respirations 10 blood pressure 126/83 pulse ox 99%  Physical Exam: General: Critically ill appearing  Head: ETT/OG in place  Eyes: Anicteric  Neck: Supple, trachea midline  Lungs:  Scattered rhonchi, on ventilator  Heart: S1S2 no rubs  Abdomen:  Soft, nontender, bowel sounds present  Extremities: Trace peripheral edema.  Neurologic: Arousable  Skin: No lesions  Access: Right femoral dialysis catheter    Basic Metabolic Panel: Recent Labs  Lab 05/21/17 0539 05/23/17 0738 05/26/17 0753  NA 135 134* 133*  K 4.0 3.9 3.4*  CL 99* 97* 95*  CO2 25 28 26   GLUCOSE 122* 97 72  BUN 53* 46* 54*  CREATININE 5.64* 5.38* 6.60*  CALCIUM 7.7* 7.9* 8.3*  PHOS 6.4* 6.5* 8.0*    Liver Function Tests: Recent Labs  Lab 05/21/17 0539 05/23/17 0738 05/26/17 0753  ALBUMIN 1.3* 1.2* 1.5*   No results for input(s): LIPASE, AMYLASE in the last 168 hours. No results for input(s): AMMONIA in the last 168 hours.  CBC: Recent Labs  Lab 05/21/17 0539 05/22/17 1148 05/23/17 0738 05/26/17 0753  WBC 12.4* 11.6* 10.3 8.2  HGB 6.0* 6.8* 7.3* 7.9*  HCT 18.7* 20.4* 22.9* 24.6*  MCV 91.7 89.9 90.9 92.8  PLT 454* 467* 533* 474*    Cardiac Enzymes: No results for input(s): CKTOTAL, CKMB, CKMBINDEX, TROPONINI in the last 168 hours.  BNP: Invalid input(s): POCBNP  CBG: No results for input(s): GLUCAP in the last 168 hours.  Microbiology: Results for orders placed or performed during the hospital encounter of 05/15/17  C difficile quick scan w PCR reflex     Status: None   Collection Time: 05/17/17 12:30 PM  Result Value Ref Range Status   C Diff antigen NEGATIVE NEGATIVE Final   C Diff toxin NEGATIVE NEGATIVE Final   C Diff interpretation No C. difficile detected.   Final    Coagulation Studies: No results for input(s): LABPROT, INR in the last 72 hours.  Urinalysis: No results for input(s): COLORURINE, LABSPEC, PHURINE, GLUCOSEU, HGBUR, BILIRUBINUR, KETONESUR, PROTEINUR, UROBILINOGEN, NITRITE, LEUKOCYTESUR in the last 72 hours.  Invalid input(s): APPERANCEUR    Imaging: Dg Abd Portable 1v  Result Date: 05/26/2017 CLINICAL DATA:  Abdominal distension, nasogastric suction. EXAM: PORTABLE ABDOMEN - 1 VIEW COMPARISON:  Portable abdominal radiograph of May 25, 2017 and May 16, 2017. FINDINGS: The esophagogastric tube tip and proximal port project in the gastric body. There decreased gaseous distention of small-bowel loops today. There is moderate distention of loops of the ascending and transverse and proximal descending colon similar to that seen previously. No free extraluminal gas collections are observed. IMPRESSION: There has been some interval improvement in the appearance of the small bowel distention. Moderate gaseous distention of the colon persists. Electronically Signed   By: David  SwazilandJordan M.D.   On: 05/26/2017 08:06   Dg Abd Portable 1v  Result Date: 05/25/2017 CLINICAL DATA:  NG tube placement EXAM: PORTABLE ABDOMEN - 1 VIEW COMPARISON:  05/16/2017, chest radiograph 05/24/2017 FINDINGS: Esophageal tube is looped in the stomach, the tip projects over the mid gastric region. Interval increase in gaseous dilatation of small and large bowel. Right femoral catheter projects over the right upper sacrum. IMPRESSION: 1. Esophageal tube is looped within the stomach and the tip projects over the mid  gastric region 2. Interval increase in gaseous enlargement of small and large bowel, findings could be secondary to adynamic ileus versus partial obstruction. Electronically Signed   By: Jasmine Pang M.D.   On: 05/25/2017 18:04     Medications:       Assessment/ Plan:  57 y.o. male with a PMHx of diabetes mellitus type 2, schizophrenia, bipolar  disorder, hyperlipidemia, hypertension, who was admitted to [facility] on 05/15/2017 for ongoing treatment of acute respiratory failure secondary to bilateral pneumonia as well as acute renal failure requiring hemodialysis.  He had witnessed cardiopulmonary arrest prior to being brought to outside hospital.  1.  Acute renal failure secondary to septic shock. 2.  Acute respiratory failure currently on the ventilator. 3.  Anemia unspecified. 4.  Hypotension.  Plan:  Patient seen and evaluated during hemodialysis today.  He appears to be tolerating well thus far.  We will plan for dialysis again on Wednesday.  Patient remains on the ventilator and has proven difficult to wean.  Hemoglobin currently up to 7.9.  We will need to continue to monitor this as well.    LOS: 0 Adream Parzych 2/4/20193:02 PM

## 2017-05-28 LAB — RENAL FUNCTION PANEL
Albumin: 1.4 g/dL — ABNORMAL LOW (ref 3.5–5.0)
Anion gap: 13 (ref 5–15)
BUN: 48 mg/dL — ABNORMAL HIGH (ref 6–20)
CO2: 24 mmol/L (ref 22–32)
Calcium: 8 mg/dL — ABNORMAL LOW (ref 8.9–10.3)
Chloride: 94 mmol/L — ABNORMAL LOW (ref 101–111)
Creatinine, Ser: 6.26 mg/dL — ABNORMAL HIGH (ref 0.61–1.24)
GFR calc Af Amer: 11 mL/min — ABNORMAL LOW (ref >60)
GFR calc non Af Amer: 9 mL/min — ABNORMAL LOW (ref >60)
Glucose, Bld: 78 mg/dL (ref 65–99)
Phosphorus: 7.5 mg/dL — ABNORMAL HIGH (ref 2.5–4.6)
Potassium: 2.6 mmol/L — CL (ref 3.5–5.1)
Sodium: 131 mmol/L — ABNORMAL LOW (ref 135–145)

## 2017-05-28 LAB — CBC
HCT: 20.5 % — ABNORMAL LOW (ref 39.0–52.0)
Hemoglobin: 6.6 g/dL — CL (ref 13.0–17.0)
MCH: 29.2 pg (ref 26.0–34.0)
MCHC: 32.2 g/dL (ref 30.0–36.0)
MCV: 90.7 fL (ref 78.0–100.0)
Platelets: 383 10*3/uL (ref 150–400)
RBC: 2.26 MIL/uL — ABNORMAL LOW (ref 4.22–5.81)
RDW: 15.3 % (ref 11.5–15.5)
WBC: 7.4 10*3/uL (ref 4.0–10.5)

## 2017-05-28 LAB — PREPARE RBC (CROSSMATCH)

## 2017-05-28 NOTE — Progress Notes (Signed)
  [facility]  ROUNDING NOTE   Subjective:  Patient just completed hemodialysis. Ultrafiltration achieved was 1.4 kg. He remains on the ventilator at this time.  Objective:  Vital signs in last 24 hours:  Temperature 96.8 pulse 88 respirations 14 blood pressure 122/66  Physical Exam: General: Critically ill appearing  Head: ETT/OG in place  Eyes: Anicteric  Neck: Supple, trachea midline  Lungs:  Scattered rhonchi, on ventilator  Heart: S1S2 no rubs  Abdomen:  Soft, nontender, bowel sounds present  Extremities: Trace peripheral edema.  Neurologic: Awake and follows simple commands  Skin: No lesions  Access: Right femoral dialysis catheter    Basic Metabolic Panel: Recent Labs  Lab 05/23/17 0738 05/26/17 0753 05/28/17 0630  NA 134* 133* 131*  K 3.9 3.4* 2.6*  CL 97* 95* 94*  CO2 28 26 24   GLUCOSE 97 72 78  BUN 46* 54* 48*  CREATININE 5.38* 6.60* 6.26*  CALCIUM 7.9* 8.3* 8.0*  PHOS 6.5* 8.0* 7.5*    Liver Function Tests: Recent Labs  Lab 05/23/17 0738 05/26/17 0753 05/28/17 0630  ALBUMIN 1.2* 1.5* 1.4*   No results for input(s): LIPASE, AMYLASE in the last 168 hours. No results for input(s): AMMONIA in the last 168 hours.  CBC: Recent Labs  Lab 05/22/17 1148 05/23/17 0738 05/26/17 0753 05/28/17 0630  WBC 11.6* 10.3 8.2 7.4  HGB 6.8* 7.3* 7.9* 6.6*  HCT 20.4* 22.9* 24.6* 20.5*  MCV 89.9 90.9 92.8 90.7  PLT 467* 533* 474* 383    Cardiac Enzymes: No results for input(s): CKTOTAL, CKMB, CKMBINDEX, TROPONINI in the last 168 hours.  BNP: Invalid input(s): POCBNP  CBG: No results for input(s): GLUCAP in the last 168 hours.  Microbiology: Results for orders placed or performed during the hospital encounter of 05/15/17  C difficile quick scan w PCR reflex     Status: None   Collection Time: 05/17/17 12:30 PM  Result Value Ref Range Status   C Diff antigen NEGATIVE NEGATIVE Final   C Diff toxin NEGATIVE NEGATIVE Final   C Diff  interpretation No C. difficile detected.  Final    Coagulation Studies: No results for input(s): LABPROT, INR in the last 72 hours.  Urinalysis: No results for input(s): COLORURINE, LABSPEC, PHURINE, GLUCOSEU, HGBUR, BILIRUBINUR, KETONESUR, PROTEINUR, UROBILINOGEN, NITRITE, LEUKOCYTESUR in the last 72 hours.  Invalid input(s): APPERANCEUR    Imaging: No results found.   Medications:       Assessment/ Plan:  57 y.o. male with a PMHx of diabetes mellitus type 2, schizophrenia, bipolar disorder, hyperlipidemia, hypertension, who was admitted to [facility] on 05/15/2017 for ongoing treatment of acute respiratory failure secondary to bilateral pneumonia as well as acute renal failure requiring hemodialysis.  He had witnessed cardiopulmonary arrest prior to being brought to outside hospital.  1.  Acute renal failure secondary to septic shock. 2.  Acute respiratory failure currently on the ventilator. 3.  Anemia unspecified. 4.  Hypotension.  Plan:  Patient just completed hemodialysis.  He tolerated this well.  Ultrafiltration achieved was 1.4 kg.  His acute renal failure has been rather prolonged.  Patient continues to have acute respiratory failure and remains on the ventilator.  Hopefully tracheostomy is being planned.  Continue to monitor serum electrolytes, CBC, and blood pressure.    LOS: 0 Deva Ron 2/6/20194:16 PM

## 2017-05-30 ENCOUNTER — Encounter (HOSPITAL_COMMUNITY): Payer: Medicaid Other | Admitting: Certified Registered"

## 2017-05-30 ENCOUNTER — Other Ambulatory Visit (HOSPITAL_COMMUNITY): Payer: Medicaid Other

## 2017-05-30 ENCOUNTER — Encounter
Admission: RE | Disposition: A | Discharge status: Home / Self Care | Payer: Self-pay | Source: Home / Self Care | Attending: Internal Medicine

## 2017-05-30 HISTORY — PX: TRACHEOSTOMY TUBE PLACEMENT: SHX814

## 2017-05-30 LAB — CBC
HCT: 26 % — ABNORMAL LOW (ref 39.0–52.0)
HEMOGLOBIN: 8.9 g/dL — AB (ref 13.0–17.0)
MCH: 30.6 pg (ref 26.0–34.0)
MCHC: 34.2 g/dL (ref 30.0–36.0)
MCV: 89.3 fL (ref 78.0–100.0)
PLATELETS: 343 10*3/uL (ref 150–400)
RBC: 2.91 MIL/uL — AB (ref 4.22–5.81)
RDW: 15.2 % (ref 11.5–15.5)
WBC: 11.2 10*3/uL — AB (ref 4.0–10.5)

## 2017-05-30 LAB — RENAL FUNCTION PANEL
Albumin: 1.5 g/dL — ABNORMAL LOW (ref 3.5–5.0)
Anion gap: 12 (ref 5–15)
BUN: 43 mg/dL — ABNORMAL HIGH (ref 6–20)
CALCIUM: 8.2 mg/dL — AB (ref 8.9–10.3)
CO2: 24 mmol/L (ref 22–32)
CREATININE: 5.44 mg/dL — AB (ref 0.61–1.24)
Chloride: 94 mmol/L — ABNORMAL LOW (ref 101–111)
GFR, EST AFRICAN AMERICAN: 12 mL/min — AB (ref >60)
GFR, EST NON AFRICAN AMERICAN: 11 mL/min — AB (ref >60)
Glucose, Bld: 82 mg/dL (ref 65–99)
Phosphorus: 5.2 mg/dL — ABNORMAL HIGH (ref 2.5–4.6)
Potassium: 3 mmol/L — ABNORMAL LOW (ref 3.5–5.1)
SODIUM: 130 mmol/L — AB (ref 135–145)

## 2017-05-30 SURGERY — CREATION, TRACHEOSTOMY
Anesthesia: General

## 2017-05-30 MED ORDER — FENTANYL CITRATE (PF) 250 MCG/5ML IJ SOLN
INTRAMUSCULAR | Status: AC
  Filled 2017-05-30: qty 5

## 2017-05-30 MED ORDER — MIDAZOLAM HCL 2 MG/2ML IJ SOLN
INTRAMUSCULAR | Status: AC
  Filled 2017-05-30: qty 4

## 2017-05-30 MED ORDER — GLYCOPYRROLATE 0.2 MG/ML IJ SOLN
INTRAMUSCULAR | Status: DC | PRN
  Administered 2017-05-30: 0.2 mg via INTRAVENOUS

## 2017-05-30 MED ORDER — ONDANSETRON HCL 4 MG/2ML IJ SOLN
INTRAMUSCULAR | Status: AC
  Filled 2017-05-30: qty 2

## 2017-05-30 MED ORDER — FENTANYL CITRATE (PF) 250 MCG/5ML IJ SOLN
INTRAMUSCULAR | Status: DC | PRN
  Administered 2017-05-30 (×2): 100 ug via INTRAVENOUS

## 2017-05-30 MED ORDER — MIDAZOLAM HCL 2 MG/2ML IJ SOLN
INTRAMUSCULAR | Status: DC | PRN
  Administered 2017-05-30: 2 mg via INTRAVENOUS

## 2017-05-30 MED ORDER — ROCURONIUM BROMIDE 10 MG/ML (PF) SYRINGE
PREFILLED_SYRINGE | INTRAVENOUS | Status: AC
  Filled 2017-05-30: qty 5

## 2017-05-30 MED ORDER — METOPROLOL TARTRATE 5 MG/5ML IV SOLN
INTRAVENOUS | Status: DC | PRN
  Administered 2017-05-30: 1 mg via INTRAVENOUS
  Administered 2017-05-30: 2 mg via INTRAVENOUS

## 2017-05-30 MED ORDER — 0.9 % SODIUM CHLORIDE (POUR BTL) OPTIME
TOPICAL | Status: DC | PRN
  Administered 2017-05-30: 1000 mL

## 2017-05-30 MED ORDER — ONDANSETRON HCL 4 MG/2ML IJ SOLN
INTRAMUSCULAR | Status: DC | PRN
  Administered 2017-05-30: 4 mg via INTRAVENOUS

## 2017-05-30 MED ORDER — NEOSTIGMINE METHYLSULFATE 10 MG/10ML IV SOLN
INTRAVENOUS | Status: DC | PRN
  Administered 2017-05-30: 3 mg via INTRAVENOUS

## 2017-05-30 MED ORDER — LACTATED RINGERS IV SOLN
INTRAVENOUS | Status: DC | PRN
  Administered 2017-05-30: 12:00:00 via INTRAVENOUS

## 2017-05-30 MED ORDER — ROCURONIUM BROMIDE 100 MG/10ML IV SOLN
INTRAVENOUS | Status: DC | PRN
  Administered 2017-05-30: 40 mg via INTRAVENOUS

## 2017-05-30 MED ORDER — LIDOCAINE-EPINEPHRINE 1 %-1:100000 IJ SOLN
INTRAMUSCULAR | Status: DC | PRN
  Administered 2017-05-30: 5 mL

## 2017-05-30 SURGICAL SUPPLY — 47 items
ATTRACTOMAT 16X20 MAGNETIC DRP (DRAPES) IMPLANT
BLADE SURG 15 STRL LF DISP TIS (BLADE) ×1 IMPLANT
BLADE SURG 15 STRL SS (BLADE) ×2
CLEANER TIP ELECTROSURG 2X2 (MISCELLANEOUS) ×3 IMPLANT
COVER SURGICAL LIGHT HANDLE (MISCELLANEOUS) ×3 IMPLANT
DRAPE HALF SHEET 40X57 (DRAPES) ×3 IMPLANT
ELECT COATED BLADE 2.86 ST (ELECTRODE) ×3 IMPLANT
ELECT REM PT RETURN 9FT ADLT (ELECTROSURGICAL) ×3
ELECTRODE REM PT RTRN 9FT ADLT (ELECTROSURGICAL) ×1 IMPLANT
GAUZE SPONGE 4X4 16PLY XRAY LF (GAUZE/BANDAGES/DRESSINGS) ×3 IMPLANT
GEL ULTRASOUND 20GR AQUASONIC (MISCELLANEOUS) ×3 IMPLANT
GLOVE BIO SURGEON STRL SZ7.5 (GLOVE) ×3 IMPLANT
GLOVE BIOGEL PI IND STRL 6 (GLOVE) ×1 IMPLANT
GLOVE BIOGEL PI IND STRL 7.0 (GLOVE) ×1 IMPLANT
GLOVE BIOGEL PI IND STRL 7.5 (GLOVE) ×1 IMPLANT
GLOVE BIOGEL PI INDICATOR 6 (GLOVE) ×2
GLOVE BIOGEL PI INDICATOR 7.0 (GLOVE) ×2
GLOVE BIOGEL PI INDICATOR 7.5 (GLOVE) ×2
GLOVE SS BIOGEL STRL SZ 7.5 (GLOVE) ×1 IMPLANT
GLOVE SUPERSENSE BIOGEL SZ 7.5 (GLOVE) ×2
GLOVE SURG SS PI 6.0 STRL IVOR (GLOVE) ×3 IMPLANT
GLOVE SURG SS PI 7.0 STRL IVOR (GLOVE) ×3 IMPLANT
GOWN STRL REUS W/ TWL LRG LVL3 (GOWN DISPOSABLE) ×3 IMPLANT
GOWN STRL REUS W/ TWL XL LVL3 (GOWN DISPOSABLE) ×1 IMPLANT
GOWN STRL REUS W/TWL LRG LVL3 (GOWN DISPOSABLE) ×6
GOWN STRL REUS W/TWL XL LVL3 (GOWN DISPOSABLE) ×2
HOLDER TRACH TUBE VELCRO 19.5 (MISCELLANEOUS) ×3 IMPLANT
KIT BASIN OR (CUSTOM PROCEDURE TRAY) ×3 IMPLANT
KIT ROOM TURNOVER OR (KITS) ×3 IMPLANT
KIT SUCTION CATH 14FR (SUCTIONS) ×3 IMPLANT
NEEDLE HYPO 25GX1X1/2 BEV (NEEDLE) ×3 IMPLANT
NS IRRIG 1000ML POUR BTL (IV SOLUTION) ×3 IMPLANT
PACK EENT II TURBAN DRAPE (CUSTOM PROCEDURE TRAY) ×3 IMPLANT
PAD ARMBOARD 7.5X6 YLW CONV (MISCELLANEOUS) IMPLANT
PENCIL BUTTON HOLSTER BLD 10FT (ELECTRODE) ×3 IMPLANT
SPONGE DRAIN TRACH 4X4 STRL 2S (GAUZE/BANDAGES/DRESSINGS) ×3 IMPLANT
SPONGE INTESTINAL PEANUT (DISPOSABLE) ×3 IMPLANT
SUT SILK 2 0 SH CR/8 (SUTURE) ×3 IMPLANT
SUT SILK 3 0 TIES 10X30 (SUTURE) IMPLANT
SYR 5ML LUER SLIP (SYRINGE) ×3 IMPLANT
SYR CONTROL 10ML LL (SYRINGE) ×3 IMPLANT
TOWEL OR 17X24 6PK STRL BLUE (TOWEL DISPOSABLE) ×3 IMPLANT
TOWEL OR 17X26 10 PK STRL BLUE (TOWEL DISPOSABLE) ×3 IMPLANT
TUBE CONNECTING 12'X1/4 (SUCTIONS) ×1
TUBE CONNECTING 12X1/4 (SUCTIONS) ×2 IMPLANT
TUBE TRACH SHILEY  6 DIST  CUF (TUBING) ×3 IMPLANT
TUBE TRACH SHILEY 8 DIST CUF (TUBING) IMPLANT

## 2017-05-30 NOTE — Consult Note (Signed)
Chief Complaint: Patient was seen in consultation today for renal failure  Referring Physician(s): Dr. Sharyon Medicus  Supervising Physician: Malachy Moan  Patient Status: [facility]  History of Present Illness: Nathan Branch is a 57 y.o. male with past medical history of DM, schizophrenia, bipolar disorder, HLD, HTN, renal failure, and respiratory failure requiring trach support who was admitted to [facility] for ongoing medical needs.  IR consulted for tunneled dialysis catheter placement at the request of Dr. Sharyon Medicus.  Patient currently has a temporary catheter in the right groin.   No past medical history on file.  Allergies: Aspirin   Medications: Prior to Admission medications   Not on File     No family history on file.  Social History   Socioeconomic History  . Marital status: Not on file    Spouse name: Not on file  . Number of children: Not on file  . Years of education: Not on file  . Highest education level: Not on file  Social Needs  . Financial resource strain: Not on file  . Food insecurity - worry: Not on file  . Food insecurity - inability: Not on file  . Transportation needs - medical: Not on file  . Transportation needs - non-medical: Not on file  Occupational History  . Not on file  Tobacco Use  . Smoking status: Not on file  Substance and Sexual Activity  . Alcohol use: Not on file  . Drug use: Not on file  . Sexual activity: Not on file  Other Topics Concern  . Not on file  Social History Narrative  . Not on file    Review of Systems  Unable to perform ROS: Acuity of condition    Vital Signs: There were no vitals taken for this visit.  Physical Exam  Constitutional: He appears well-developed.  Cardiovascular: Normal rate, regular rhythm and normal heart sounds.  Pulmonary/Chest: Breath sounds normal. No respiratory distress.  Trach in place. Effort normal with decreased respiratory rate  Neurological:  Arouses  easily  Skin: Skin is warm and dry.  Temp cath in place in right groin.   Nursing note and vitals reviewed.   Imaging: Dg Chest Port 1 View  Result Date: 05/24/2017 CLINICAL DATA:  Bilateral pneumonia. EXAM: PORTABLE CHEST 1 VIEW COMPARISON:  May 16, 2017 FINDINGS: The ETT terminates at the carina. Recommend withdrawing 2 cm. The NG tube terminates below today's film. No pneumothorax. Infiltrate in the right upper lobe persists. It is more focal in the interval. Effusion and opacity in the left base persists, similar in the interval. No other interval changes. IMPRESSION: 1. The ETT terminates near the carina.  Recommend withdrawing 2 cm. 2. Infiltrate in the right upper lobe persists but is more focal and dense in appearance. Recommend follow-up to resolution. 3. Effusion and opacity in the left base is stable. 4. These results will be called to the ordering clinician or representative by the Radiologist Assistant, and communication documented in the PACS or zVision Dashboard. Electronically Signed   By: Gerome Sam III M.D   On: 05/24/2017 08:58   Dg Chest Port 1 View  Result Date: 05/16/2017 CLINICAL DATA:  Respiratory failure EXAM: PORTABLE CHEST 1 VIEW COMPARISON:  05/15/2017 FINDINGS: Support devices are stable. Dense consolidation in the right upper lobe. Bibasilar airspace opacities. Heart is normal size. Probable small layering left effusion. No significant change since prior study. IMPRESSION: No interval change. Electronically Signed   By: Charlett Nose M.D.  On: 05/16/2017 08:45   Dg Abd Portable 1v  Result Date: 05/26/2017 CLINICAL DATA:  Abdominal distension, nasogastric suction. EXAM: PORTABLE ABDOMEN - 1 VIEW COMPARISON:  Portable abdominal radiograph of May 25, 2017 and May 16, 2017. FINDINGS: The esophagogastric tube tip and proximal port project in the gastric body. There decreased gaseous distention of small-bowel loops today. There is moderate distention of loops  of the ascending and transverse and proximal descending colon similar to that seen previously. No free extraluminal gas collections are observed. IMPRESSION: There has been some interval improvement in the appearance of the small bowel distention. Moderate gaseous distention of the colon persists. Electronically Signed   By: David  [place] M.D.   On: 05/26/2017 08:06   Dg Abd Portable 1v  Result Date: 05/25/2017 CLINICAL DATA:  NG tube placement EXAM: PORTABLE ABDOMEN - 1 VIEW COMPARISON:  05/16/2017, chest radiograph 05/24/2017 FINDINGS: Esophageal tube is looped in the stomach, the tip projects over the mid gastric region. Interval increase in gaseous dilatation of small and large bowel. Right femoral catheter projects over the right upper sacrum. IMPRESSION: 1. Esophageal tube is looped within the stomach and the tip projects over the mid gastric region 2. Interval increase in gaseous enlargement of small and large bowel, findings could be secondary to adynamic ileus versus partial obstruction. Electronically Signed   By: Jasmine Pang M.D.   On: 05/25/2017 18:04   Dg Abd Portable 1v  Result Date: 05/16/2017 CLINICAL DATA:  Initial evaluation for nasogastric tube placement. EXAM: PORTABLE ABDOMEN - 1 VIEW COMPARISON:  None. FINDINGS: NG tube in place with tip in side hole overlying the stomach, well beyond the GE junction. Tip projects distally. Bowel gas pattern within normal limits without obstruction or ileus. Right-sided femoral catheter overlies the lower right pelvis. Dense retrocardiac left lower lobe opacity, which may reflect atelectasis or infiltrate. Suspected small left pleural effusion. IMPRESSION: 1. NG tube in place with tip and side hole well beyond the GE junction. Tip projects distally. 2. Dense retrocardiac left lower lobe opacity, which may reflect atelectasis or infiltrate. 3. Small left pleural effusion. Electronically Signed   By: Rise Mu M.D.   On: 05/16/2017 03:00     Labs:  CBC: Recent Labs    05/23/17 0738 05/26/17 0753 05/28/17 0630 05/30/17 0536  WBC 10.3 8.2 7.4 11.2*  HGB 7.3* 7.9* 6.6* 8.9*  HCT 22.9* 24.6* 20.5* 26.0*  PLT 533* 474* 383 343    COAGS: No results for input(s): INR, APTT in the last 8760 hours.  BMP: Recent Labs    05/23/17 0738 05/26/17 0753 05/28/17 0630 05/30/17 0536  NA 134* 133* 131* 130*  K 3.9 3.4* 2.6* 3.0*  CL 97* 95* 94* 94*  CO2 28 26 24 24   GLUCOSE 97 72 78 82  BUN 46* 54* 48* 43*  CALCIUM 7.9* 8.3* 8.0* 8.2*  CREATININE 5.38* 6.60* 6.26* 5.44*  GFRNONAA 11* 9* 9* 11*  GFRAA 13* 10* 11* 12*    LIVER FUNCTION TESTS: Recent Labs    05/16/17 0012  05/16/17 1324  05/23/17 0738 05/26/17 0753 05/28/17 0630 05/30/17 0536  BILITOT 1.0  --  1.5*  --   --   --   --   --   AST 189*  --  691*  --   --   --   --   --   ALT 100*  --  216*  --   --   --   --   --  ALKPHOS 501*  --  950*  --   --   --   --   --   PROT 6.0*  --  6.0*  --   --   --   --   --   ALBUMIN 1.2*   < > 1.2*   < > 1.2* 1.5* 1.4* 1.5*   < > = values in this interval not displayed.    TUMOR MARKERS: No results for input(s): AFPTM, CEA, CA199, CHROMGRNA in the last 8760 hours.  Assessment and Plan: Renal failure IR consulted for tunneled dialysis catheter placement. Patient currently receiving dialysis via temp cath in the right femoral vein.  Patient also has IV access in left IJ.  Will obtain INR.  Not currently on blood thinners.  Risks and benefits discussed with the patient's guardian including, but not limited to bleeding, infection, vascular injury, pneumothorax which may require chest tube placement, air embolism or even death  All questions were answered, and guardian is agreeable to proceed. Consent signed and in chart.  Plan to proceed with placement of tunneled catheter early next week.   Thank you for this interesting consult.  I greatly enjoyed meeting Nathan Branch and look forward to participating in  their care.  A copy of this report was sent to the requesting provider on this date.  Electronically Signed: Hoyt KochKacie Sue-Ellen Mandy Fitzwater, PA 05/30/2017, 2:35 PM   I spent a total of 40 Minutes    in face to face in clinical consultation, greater than 50% of which was counseling/coordinating care for renal failure.

## 2017-05-30 NOTE — Progress Notes (Signed)
  [facility]  ROUNDING NOTE   Subjective:  Patient seen and evaluated during hemodialysis. He appears to be tolerating well.   Objective:  Vital signs in last 24 hours:  Temperature 96.4 pulse 88 respirations 25 blood pressure 148/75  Physical Exam: General: Critically ill appearing  Head: ETT/OG in place  Eyes: Anicteric  Neck: Supple, trachea midline  Lungs:  Scattered rhonchi, on ventilator  Heart: S1S2 no rubs  Abdomen:  Soft, nontender, bowel sounds present  Extremities: Trace peripheral edema.  Neurologic: Lethargic, was given fentanyl  Skin: No lesions  Access: Right femoral dialysis catheter    Basic Metabolic Panel: Recent Labs  Lab 05/26/17 0753 05/28/17 0630 05/30/17 0536  NA 133* 131* 130*  K 3.4* 2.6* 3.0*  CL 95* 94* 94*  CO2 26 24 24   GLUCOSE 72 78 82  BUN 54* 48* 43*  CREATININE 6.60* 6.26* 5.44*  CALCIUM 8.3* 8.0* 8.2*  PHOS 8.0* 7.5* 5.2*    Liver Function Tests: Recent Labs  Lab 05/26/17 0753 05/28/17 0630 05/30/17 0536  ALBUMIN 1.5* 1.4* 1.5*   No results for input(s): LIPASE, AMYLASE in the last 168 hours. No results for input(s): AMMONIA in the last 168 hours.  CBC: Recent Labs  Lab 05/26/17 0753 05/28/17 0630 05/30/17 0536  WBC 8.2 7.4 11.2*  HGB 7.9* 6.6* 8.9*  HCT 24.6* 20.5* 26.0*  MCV 92.8 90.7 89.3  PLT 474* 383 343    Cardiac Enzymes: No results for input(s): CKTOTAL, CKMB, CKMBINDEX, TROPONINI in the last 168 hours.  BNP: Invalid input(s): POCBNP  CBG: No results for input(s): GLUCAP in the last 168 hours.  Microbiology: Results for orders placed or performed during the hospital encounter of 05/15/17  C difficile quick scan w PCR reflex     Status: None   Collection Time: 05/17/17 12:30 PM  Result Value Ref Range Status   C Diff antigen NEGATIVE NEGATIVE Final   C Diff toxin NEGATIVE NEGATIVE Final   C Diff interpretation No C. difficile detected.  Final    Coagulation Studies: No results  for input(s): LABPROT, INR in the last 72 hours.  Urinalysis: No results for input(s): COLORURINE, LABSPEC, PHURINE, GLUCOSEU, HGBUR, BILIRUBINUR, KETONESUR, PROTEINUR, UROBILINOGEN, NITRITE, LEUKOCYTESUR in the last 72 hours.  Invalid input(s): APPERANCEUR    Imaging: No results found.   Medications:       Assessment/ Plan:  57 y.o. male with a PMHx of diabetes mellitus type 2, schizophrenia, bipolar disorder, hyperlipidemia, hypertension, who was admitted to [facility] on 05/15/2017 for ongoing treatment of acute respiratory failure secondary to bilateral pneumonia as well as acute renal failure requiring hemodialysis.  He had witnessed cardiopulmonary arrest prior to being brought to outside hospital.  1.  Acute renal failure secondary to septic shock. 2.  Acute respiratory failure currently on the ventilator. 3.  Anemia unspecified. 4.  Hypotension.  Plan:  Patient seen and evaluated during hemodialysis.  Thus far he appears to be tolerating well.  Creatinine continues to trend high.  Therefore it appears he will need ongoing dialysis.  Ultrafiltration target 1.5 kg today.  Tentatively we will plan for dialysis again on Monday.  Continues to have a guarded prognosis.  LOS: 0 Nathan Branch 2/8/20198:40 AM

## 2017-05-30 NOTE — Brief Op Note (Signed)
05/30/2017  12:43 PM  PATIENT:  Nathan Branch  57 y.o. male  PRE-OPERATIVE DIAGNOSIS:  RESPIRATORY FAILURE  POST-OPERATIVE DIAGNOSIS:  RESPIRATORY FAILURE  PROCEDURE:  Procedure(s): TRACHEOSTOMY (N/A) #6 [place]  SURGEON:  Surgeon(s) and Role:    Drema Halon* Newman, Christopher E, MD - Primary  PHYSICIAN ASSISTANT:   ASSISTANTS: none   ANESTHESIA:   general  EBL:  minimal   BLOOD ADMINISTERED:none  DRAINS: none   LOCAL MEDICATIONS USED:  XYLOCAINE   SPECIMEN:  No Specimen  DISPOSITION OF SPECIMEN:  N/A  COUNTS:  YES  TOURNIQUET:  * No tourniquets in log *  DICTATION: .Other Dictation: Dictation Number [id number]  PLAN OF CARE: Discharge to home after PACU  PATIENT DISPOSITION:  PACU - hemodynamically stable.   Delay start of Pharmacological VTE agent (>24hrs) due to surgical blood loss or risk of bleeding: yes

## 2017-05-30 NOTE — Anesthesia Preprocedure Evaluation (Addendum)
Anesthesia Evaluation  Patient identified by MRN, date of birth, ID band Patient awake    Reviewed: Allergy & Precautions, H&P , NPO status , Patient's Chart, lab work & pertinent test results  Airway Mallampati: Intubated       Dental no notable dental hx. (+) Teeth Intact, Dental Advisory Given   Pulmonary  VDRF   Pulmonary exam normal breath sounds clear to auscultation       Cardiovascular negative cardio ROS   Rhythm:Regular Rate:Normal     Neuro/Psych Bipolar Disorder Schizophrenia negative neurological ROS  negative psych ROS   GI/Hepatic negative GI ROS, Neg liver ROS,   Endo/Other  negative endocrine ROS  Renal/GU ARF and DialysisRenal diseasenegative Renal ROS  negative genitourinary   Musculoskeletal   Abdominal   Peds  Hematology negative hematology ROS (+)   Anesthesia Other Findings   Reproductive/Obstetrics negative OB ROS                            Anesthesia Physical Anesthesia Plan  ASA: IV  Anesthesia Plan: General   Post-op Pain Management:    Induction: Intravenous  PONV Risk Score and Plan: 2 and Treatment may vary due to age or medical condition and Ondansetron  Airway Management Planned: Tracheostomy  Additional Equipment:   Intra-op Plan:   Post-operative Plan: Possible Post-op intubation/ventilation  Informed Consent: I have reviewed the patients History and Physical, chart, labs and discussed the procedure including the risks, benefits and alternatives for the proposed anesthesia with the patient or authorized representative who has indicated his/her understanding and acceptance.   Dental advisory given  Plan Discussed with: CRNA  Anesthesia Plan Comments:         Anesthesia Quick Evaluation

## 2017-05-30 NOTE — Transfer of Care (Signed)
Immediate Anesthesia Transfer of Care Note  Patient: Nathan Branch  Procedure(s) Performed: TRACHEOSTOMY (N/A )  Patient Location: PACU and Nursing Unit  Anesthesia Type:General  Level of Consciousness: sedated, drowsy, responds to stimulation and Patient remains intubated per anesthesia plan  Airway & Oxygen Therapy: Patient Spontanous Breathing and Patient connected to tracheostomy mask oxygen  Post-op Assessment: Report given to RN and Patient moving all extremities X 4  Post vital signs: Reviewed and stable  Last Vitals: There were no vitals filed for this visit.  Last Pain: There were no vitals filed for this visit.       Complications: No apparent anesthesia complications

## 2017-05-30 NOTE — Anesthesia Postprocedure Evaluation (Signed)
Anesthesia Post Note  Patient: Nathan Branch  Procedure(s) Performed: TRACHEOSTOMY (N/A )     Patient location during evaluation: Other Anesthesia Type: General Level of consciousness: awake and alert Pain management: pain level controlled Vital Signs Assessment: post-procedure vital signs reviewed and stable Respiratory status: spontaneous breathing, nonlabored ventilation and respiratory function stable Cardiovascular status: blood pressure returned to baseline and stable Postop Assessment: no apparent nausea or vomiting Anesthetic complications: no    Last Vitals: There were no vitals filed for this visit.  Last Pain: There were no vitals filed for this visit.               Nathan Branch

## 2017-05-30 NOTE — H&P (Signed)
PREOPERATIVE H&P  Chief Complaint: acute on chronic respiratory failure  HPI: Nathan Branch is a 57 y.o. male who presents for evaluation of chronic respiratory failure who has been intubated for over 2 weeks with inability to wean off vent. I was consulted to place a trach.  Patient with history of schizophrenia with bipolar disorder and diabetes. He had a cardiopulmonary arrest on 1/18 requiring hospitalization. He developed aspiration pneumonia as well as renal failure presently requiring dialysis. Intubation was attempted on 1/24 but had to be re intubated later that day. [facility] intubated on vent. Attempts to wean have been unsuccessful and trach was recommended.  No past medical history on file. reviewed Social History   Socioeconomic History  . Marital status: Not on file    Spouse name: Not on file  . Number of children: Not on file  . Years of education: Not on file  . Highest education level: Not on file  Social Needs  . Financial resource strain: Not on file  . Food insecurity - worry: Not on file  . Food insecurity - inability: Not on file  . Transportation needs - medical: Not on file  . Transportation needs - non-medical: Not on file  Occupational History  . Not on file  Tobacco Use  . Smoking status: Not on file  Substance and Sexual Activity  . Alcohol use: Not on file  . Drug use: Not on file  . Sexual activity: Not on file  Other Topics Concern  . Not on file  Social History Narrative  . Not on file   No family history on file. Allergies not on file Prior to Admission medications   Not on File     Positive ROS: negative . On dialysis  All other systems have been reviewed and were otherwise negative with the exception of those mentioned in the HPI and as above.  Physical Exam: There were no vitals filed for this visit.  General: Awake and intubated Neck: No palpable adenopathy or thyroid nodules. Trach  midline Ear: Ear canal is clear with normal appearing TMs Cardiovascular: Regular rate and rhythm, no murmur.  Respiratory: Clear to auscultation Neurologic: Alert   Assessment/Plan: RESPIRATORY FAILURE Plan for Procedure(s): TRACHEOSTOMY   Dillard Cannonhristopher Kizzy Olafson, MD 05/30/2017 11:25 AM

## 2017-05-31 LAB — PROTIME-INR
INR: 1.44
Prothrombin Time: 17.4 seconds — ABNORMAL HIGH (ref 11.4–15.2)

## 2017-06-01 LAB — BPAM RBC
Blood Product Expiration Date: 201903012359
Blood Product Expiration Date: 201903112359
Blood Product Expiration Date: 201903112359
ISSUE DATE / TIME: 201902061939
UNIT TYPE AND RH: 5100
UNIT TYPE AND RH: 5100
UNIT TYPE AND RH: 5100

## 2017-06-01 LAB — TYPE AND SCREEN
ABO/RH(D): A POS
ANTIBODY SCREEN: NEGATIVE
DAT, IgG: NEGATIVE
UNIT DIVISION: 0
Unit division: 0
Unit division: 0

## 2017-06-02 ENCOUNTER — Other Ambulatory Visit (HOSPITAL_COMMUNITY): Payer: Medicaid Other

## 2017-06-02 ENCOUNTER — Encounter (HOSPITAL_COMMUNITY): Payer: Self-pay | Admitting: Interventional Radiology

## 2017-06-02 HISTORY — PX: IR FLUORO GUIDE CV LINE RIGHT: IMG2283

## 2017-06-02 HISTORY — PX: IR US GUIDE VASC ACCESS RIGHT: IMG2390

## 2017-06-02 LAB — CBC
HCT: 28.5 % — ABNORMAL LOW (ref 39.0–52.0)
Hemoglobin: 9.5 g/dL — ABNORMAL LOW (ref 13.0–17.0)
MCH: 30.5 pg (ref 26.0–34.0)
MCHC: 33.3 g/dL (ref 30.0–36.0)
MCV: 91.6 fL (ref 78.0–100.0)
Platelets: 231 10*3/uL (ref 150–400)
RBC: 3.11 MIL/uL — ABNORMAL LOW (ref 4.22–5.81)
RDW: 14.9 % (ref 11.5–15.5)
WBC: 12.3 10*3/uL — ABNORMAL HIGH (ref 4.0–10.5)

## 2017-06-02 LAB — RENAL FUNCTION PANEL
ALBUMIN: 1.5 g/dL — AB (ref 3.5–5.0)
Anion gap: 12 (ref 5–15)
BUN: 45 mg/dL — AB (ref 6–20)
CO2: 26 mmol/L (ref 22–32)
Calcium: 8.4 mg/dL — ABNORMAL LOW (ref 8.9–10.3)
Chloride: 95 mmol/L — ABNORMAL LOW (ref 101–111)
Creatinine, Ser: 4.85 mg/dL — ABNORMAL HIGH (ref 0.61–1.24)
GFR calc Af Amer: 14 mL/min — ABNORMAL LOW (ref >60)
GFR calc non Af Amer: 12 mL/min — ABNORMAL LOW (ref >60)
GLUCOSE: 71 mg/dL (ref 65–99)
PHOSPHORUS: 4.9 mg/dL — AB (ref 2.5–4.6)
POTASSIUM: 3.1 mmol/L — AB (ref 3.5–5.1)
Sodium: 133 mmol/L — ABNORMAL LOW (ref 135–145)

## 2017-06-02 MED ORDER — HEPARIN SODIUM (PORCINE) 1000 UNIT/ML IJ SOLN
INTRAMUSCULAR | Status: AC
  Administered 2017-06-02: 3.2 mL
  Filled 2017-06-02: qty 1

## 2017-06-02 MED ORDER — CEFAZOLIN SODIUM-DEXTROSE 2-4 GM/100ML-% IV SOLN
INTRAVENOUS | Status: AC
  Administered 2017-06-02: 2 g via INTRAVENOUS
  Filled 2017-06-02: qty 100

## 2017-06-02 MED ORDER — CEFAZOLIN SODIUM-DEXTROSE 2-4 GM/100ML-% IV SOLN
2.0000 g | Freq: Once | INTRAVENOUS | Status: AC
  Administered 2017-06-02: 2 g via INTRAVENOUS

## 2017-06-02 MED ORDER — MIDAZOLAM HCL 2 MG/2ML IJ SOLN
INTRAMUSCULAR | Status: AC
  Filled 2017-06-02: qty 2

## 2017-06-02 MED ORDER — LIDOCAINE HCL (PF) 1 % IJ SOLN
INTRAMUSCULAR | Status: AC | PRN
  Administered 2017-06-02: 10 mL

## 2017-06-02 MED ORDER — LIDOCAINE HCL 1 % IJ SOLN
INTRAMUSCULAR | Status: AC
  Filled 2017-06-02: qty 20

## 2017-06-02 MED ORDER — CEFAZOLIN (ANCEF) 1 G IV SOLR: 1.0000 g | INTRAVENOUS | Status: DC

## 2017-06-02 MED ORDER — FENTANYL CITRATE (PF) 100 MCG/2ML IJ SOLN
INTRAMUSCULAR | Status: AC | PRN
  Administered 2017-06-02: 50 ug via INTRAVENOUS

## 2017-06-02 MED ORDER — FENTANYL CITRATE (PF) 100 MCG/2ML IJ SOLN
INTRAMUSCULAR | Status: AC
  Filled 2017-06-02: qty 2

## 2017-06-02 NOTE — Sedation Documentation (Signed)
Patient is resting comfortably. 

## 2017-06-02 NOTE — Progress Notes (Signed)
[facility]  ROUNDING NOTE   Subjective:  Patient seen at bedside. Now has a tracheostomy in place.   Objective:  Vital signs in last 24 hours:  Pulse 91 respirations 16 blood pressure 153/88  Physical Exam: General: Critically ill appearing  Head: OM moist  Eyes: Anicteric  Neck: Tracheostomy in place  Lungs:  Scattered rhonchi, on ventilator  Heart: S1S2 no rubs  Abdomen:  Soft, nontender, bowel sounds present  Extremities: Trace peripheral edema.  Neurologic: Awake alert follows commands  Skin: No lesions  Access: Right femoral dialysis catheter    Basic Metabolic Panel: Recent Labs  Lab 05/28/17 0630 05/30/17 0536 06/02/17 0945  NA 131* 130* 133*  K 2.6* 3.0* 3.1*  CL 94* 94* 95*  CO2 _0 GLUCOSE 78 82 71  BUN 48* 43* 45*  CREATININE 6.26* 5.44* 4.85*  CALCIUM 8.0* 8.2* 8.4*  PHOS 7.5* 5.2* 4.9*    Liver Function Tests: Recent Labs  Lab 05/28/17 0630 05/30/17 0536 06/02/17 0945  ALBUMIN 1.4* 1.5* 1.5*   No results for input(s): LIPASE, AMYLASE in the last 168 hours. No results for input(s): AMMONIA in the last 168 hours.  CBC: Recent Labs  Lab 05/28/17 0630 05/30/17 0536 06/02/17 0705  WBC 7.4 11.2* 12.3*  HGB 6.6* 8.9* 9.5*  HCT 20.5* 26.0* 28.5*  MCV 90.7 89.3 91.6  PLT 383 343 231    Cardiac Enzymes: No results for input(s): CKTOTAL, CKMB, CKMBINDEX, TROPONINI in the last 168 hours.  BNP: Invalid input(s): POCBNP  CBG: No results for input(s): GLUCAP in the last 168 hours.  Microbiology: Results for orders placed or performed during the hospital encounter of 05/15/17  C difficile quick scan w PCR reflex     Status: None   Collection Time: 05/17/17 12:30 PM  Result Value Ref Range Status   C Diff antigen NEGATIVE NEGATIVE Final   C Diff toxin NEGATIVE NEGATIVE Final   C Diff interpretation No C. difficile detected.  Final    Coagulation Studies: Recent Labs    05/31/17 0601  LABPROT 17.4*  INR 1.44     Urinalysis: No results for input(s): COLORURINE, LABSPEC, PHURINE, GLUCOSEU, HGBUR, BILIRUBINUR, KETONESUR, PROTEINUR, UROBILINOGEN, NITRITE, LEUKOCYTESUR in the last 72 hours.  Invalid input(s): APPERANCEUR    Imaging: Ir Fluoro Guide Cv Line Right  Result Date: 06/02/2017 INDICATION: End-stage renal disease. In need of durable intravenous access for the continuation hemodialysis. EXAM: TUNNELED CENTRAL VENOUS HEMODIALYSIS CATHETER PLACEMENT WITH ULTRASOUND AND FLUOROSCOPIC GUIDANCE MEDICATIONS: Ancef 2 gm IV . The antibiotic was given in an appropriate time interval prior to skin puncture. ANESTHESIA/SEDATION: Fentanyl 50 mcg IV; Moderate Sedation Time:  13 minutes The patient was continuously monitored during the procedure by the interventional radiology nurse under my direct supervision. FLUOROSCOPY TIME:  Fluoroscopy Time: 18 seconds (2 mGy). COMPLICATIONS: None immediate. PROCEDURE: Informed written consent was obtained from the patient after a discussion of the risks, benefits, and alternatives to treatment. Questions regarding the procedure were encouraged and answered. The right neck and chest were prepped with chlorhexidine in a sterile fashion, and a sterile drape was applied covering the operative field. Maximum barrier sterile technique with sterile gowns and gloves were used for the procedure. A timeout was performed prior to the initiation of the procedure. After creating a small venotomy incision, a micropuncture kit was utilized to access the internal jugular vein. Real-time ultrasound guidance was utilized for vascular access including the acquisition of a permanent ultrasound image documenting patency  of the accessed vessel. The microwire was utilized to measure appropriate catheter length. A stiff Glidewire was advanced to the level of the IVC and the micropuncture sheath was exchanged for a peel-away sheath. A palindrome tunneled hemodialysis catheter measuring 19 cm from tip to  cuff was tunneled in a retrograde fashion from the anterior chest wall to the venotomy incision. The catheter was then placed through the peel-away sheath with tips ultimately positioned within the superior aspect of the right atrium. Final catheter positioning was confirmed and documented with a spot radiographic image. The catheter aspirates and flushes normally. The catheter was flushed with appropriate volume heparin dwells. The catheter exit site was secured with a 0-Prolene retention suture. The venotomy incision was closed with an interrupted 4-0 Vicryl, Dermabond and Steri-strips. Dressings were applied. The patient tolerated the procedure well without immediate post procedural complication. IMPRESSION: Successful placement of 19 cm tip to cuff tunneled hemodialysis catheter via the right internal jugular vein with tips terminating within the superior aspect of the right atrium. The catheter is ready for immediate use. Electronically Signed   By: Sandi Mariscal M.D.   On: 06/02/2017 09:38   Ir US Guide Vasc Access Right  Result Date: 06/02/2017 INDICATION: End-stage renal disease. In need of durable intravenous access for the continuation hemodialysis. EXAM: TUNNELED CENTRAL VENOUS HEMODIALYSIS CATHETER PLACEMENT WITH ULTRASOUND AND FLUOROSCOPIC GUIDANCE MEDICATIONS: Ancef 2 gm IV . The antibiotic was given in an appropriate time interval prior to skin puncture. ANESTHESIA/SEDATION: Fentanyl 50 mcg IV; Moderate Sedation Time:  13 minutes The patient was continuously monitored during the procedure by the interventional radiology nurse under my direct supervision. FLUOROSCOPY TIME:  Fluoroscopy Time: 18 seconds (2 mGy). COMPLICATIONS: None immediate. PROCEDURE: Informed written consent was obtained from the patient after a discussion of the risks, benefits, and alternatives to treatment. Questions regarding the procedure were encouraged and answered. The right neck and chest were prepped with chlorhexidine in  a sterile fashion, and a sterile drape was applied covering the operative field. Maximum barrier sterile technique with sterile gowns and gloves were used for the procedure. A timeout was performed prior to the initiation of the procedure. After creating a small venotomy incision, a micropuncture kit was utilized to access the internal jugular vein. Real-time ultrasound guidance was utilized for vascular access including the acquisition of a permanent ultrasound image documenting patency of the accessed vessel. The microwire was utilized to measure appropriate catheter length. A stiff Glidewire was advanced to the level of the IVC and the micropuncture sheath was exchanged for a peel-away sheath. A palindrome tunneled hemodialysis catheter measuring 19 cm from tip to cuff was tunneled in a retrograde fashion from the anterior chest wall to the venotomy incision. The catheter was then placed through the peel-away sheath with tips ultimately positioned within the superior aspect of the right atrium. Final catheter positioning was confirmed and documented with a spot radiographic image. The catheter aspirates and flushes normally. The catheter was flushed with appropriate volume heparin dwells. The catheter exit site was secured with a 0-Prolene retention suture. The venotomy incision was closed with an interrupted 4-0 Vicryl, Dermabond and Steri-strips. Dressings were applied. The patient tolerated the procedure well without immediate post procedural complication. IMPRESSION: Successful placement of 19 cm tip to cuff tunneled hemodialysis catheter via the right internal jugular vein with tips terminating within the superior aspect of the right atrium. The catheter is ready for immediate use. Electronically Signed   By: Eldridge Abrahams.D.  On: 06/02/2017 09:38     Medications:       Assessment/ Plan:  58 y.o. male with a PMHx of diabetes mellitus type 2, schizophrenia, bipolar disorder, hyperlipidemia,  hypertension, who was admitted to [facility] on 05/15/2017 for ongoing treatment of acute respiratory failure secondary to bilateral pneumonia as well as acute renal failure requiring hemodialysis.  He had witnessed cardiopulmonary arrest prior to being brought to outside hospital.  1.  Acute renal failure secondary to septic shock. 2.  Acute respiratory failure s/p tracheostomy 3.  Anemia unspecified. 4.  Hypotension.  Plan:  Patient seen at bedside.  He now has a tracheostomy in place.  He still has persistent acute renal failure.  He did complete dialysis today.  We will plan for dialysis again on Wednesday.   LOS: 0 Jossie Smoot 2/11/20195:26 PM

## 2017-06-02 NOTE — Op Note (Signed)
NAME:  Nathan Branch, Nathan Branch                      ACCOUNT NO.:  MEDICAL RECORD NO.:  [id number]  LOCATION:                                 FACILITY:  PHYSICIAN:  Brinlyn Cena E. Ezzard StandingNewman, M.D. DATE OF BIRTH:  DATE OF PROCEDURE:  05/30/2017 DATE OF DISCHARGE:                              OPERATIVE REPORT   PREOPERATIVE DIAGNOSIS:  Acute on chronic respiratory failure.  POSTOPERATIVE DIAGNOSIS:  Acute on chronic respiratory failure.  OPERATION PERFORMED:  Tracheotomy with a #6 Shiley cuffed tube.  SURGEON:  Kristine GarbeChristopher E. Ezzard StandingNewman, M.D.  ANESTHESIA:  General endotracheal.  COMPLICATIONS:  None.  BRIEF CLINICAL NOTE:  Nathan Branch is a 57 year old gentleman with history of bipolar disorder as well as diabetes.  He developed cardiopulmonary arrest several weeks ago sustaining hospitalization and ended up having aspiration pneumonia and renal failure requiring dialysis.  He was attempted to be extubated initially about 3 weeks ago, but had to be reintubated later that day.  [facility] was subsequently [facility] couple of days later.  He has been intubated on vent since he has been [facility] for the past 2 weeks.  I was consulted concerning performing a tracheotomy.  DESCRIPTION OF PROCEDURE:  The patient was [facility] to have a [facility]ach performed.  The patient remained in his bed.  His neck was extended with rolls beneath his shoulders.  His neck was prepped with Betadine solution, draped out with sterile towels.  The area of incision was injected with 4 to 5 mL of Xylocaine with epinephrine.  A vertical incision was made and dissection was carried down through the subcutaneous tissue with cautery. Hemostasis was obtained with cautery.  The trachea was identified as was the thyroid isthmus.  The thyroid isthmus was kind of high lying over the first and second tracheal rings.  This was divided and  retracted laterally, and a tracheotomy was made between the first and second tracheal rings below the cricoid cartilage.  A #6 Shiley cuffed tube was inserted after removing the tracheal tube without any difficulty.  This was secured to the neck with 2-0 silk sutures x4 and Velcro trach collar around the neck.  The patient was ventilated well.  He also had his NG tube that was placed transorally removed and was subsequently placed nasogastric.  The patient was subsequently [facility] with [facility]. There was minimal bleeding.          ______________________________ Kristine Garbehristopher E. Ezzard StandingNewman, M.D.     CEN/MEDQ  D:  05/30/2017  T:  05/31/2017  Job:  [id number]

## 2017-06-02 NOTE — Procedures (Signed)
Pre-procedure Diagnosis: ESRD Post-procedure Diagnosis: Same  Successful placement of tunneled HD catheter with tips terminating within the superior aspect of the right atrium.    Complications: None Immediate  EBL: Minimal   The catheter is ready for immediate use.   Jay Diahann Guajardo, MD Pager #: [phone]   

## 2017-06-04 LAB — CBC
HCT: 22.1 % — ABNORMAL LOW (ref 39.0–52.0)
Hemoglobin: 7.1 g/dL — ABNORMAL LOW (ref 13.0–17.0)
MCH: 30.3 pg (ref 26.0–34.0)
MCHC: 32.1 g/dL (ref 30.0–36.0)
MCV: 94.4 fL (ref 78.0–100.0)
PLATELETS: 407 10*3/uL — AB (ref 150–400)
RBC: 2.34 MIL/uL — ABNORMAL LOW (ref 4.22–5.81)
RDW: 15.2 % (ref 11.5–15.5)
WBC: 11.8 10*3/uL — AB (ref 4.0–10.5)

## 2017-06-04 LAB — RENAL FUNCTION PANEL
Albumin: 1.6 g/dL — ABNORMAL LOW (ref 3.5–5.0)
Anion gap: 12 (ref 5–15)
BUN: 31 mg/dL — AB (ref 6–20)
CHLORIDE: 98 mmol/L — AB (ref 101–111)
CO2: 28 mmol/L (ref 22–32)
CREATININE: 3.43 mg/dL — AB (ref 0.61–1.24)
Calcium: 8.4 mg/dL — ABNORMAL LOW (ref 8.9–10.3)
GFR calc Af Amer: 22 mL/min — ABNORMAL LOW (ref >60)
GFR calc non Af Amer: 19 mL/min — ABNORMAL LOW (ref >60)
GLUCOSE: 85 mg/dL (ref 65–99)
Phosphorus: 3.4 mg/dL (ref 2.5–4.6)
Potassium: 3 mmol/L — ABNORMAL LOW (ref 3.5–5.1)
Sodium: 138 mmol/L (ref 135–145)

## 2017-06-04 NOTE — Progress Notes (Signed)
  [facility]  ROUNDING NOTE   Subjective:  Patient seen at bedside. He is seen and evaluated during dialysis treatment. Tolerating well.   Objective:  Vital signs in last 24 hours:  Temperature 97.9 pulse 98 respirations 12 blood pressure 118/78  Physical Exam: General: Critically ill appearing  Head: OM moist  Eyes: Anicteric  Neck: Tracheostomy in place  Lungs:  Scattered rhonchi, on ventilator  Heart: S1S2 no rubs  Abdomen:  Soft, nontender, bowel sounds present  Extremities: Trace peripheral edema.  Neurologic: Awake alert follows commands  Skin: No lesions  Access: Right IJ permcath    Basic Metabolic Panel: Recent Labs  Lab 05/30/17 0536 06/02/17 0945 06/04/17 0635  NA 130* 133* 138  K 3.0* 3.1* 3.0*  CL 94* 95* 98*  CO2 24 26 28   GLUCOSE 82 71 85  BUN 43* 45* 31*  CREATININE 5.44* 4.85* 3.43*  CALCIUM 8.2* 8.4* 8.4*  PHOS 5.2* 4.9* 3.4    Liver Function Tests: Recent Labs  Lab 05/30/17 0536 06/02/17 0945 06/04/17 0635  ALBUMIN 1.5* 1.5* 1.6*   No results for input(s): LIPASE, AMYLASE in the last 168 hours. No results for input(s): AMMONIA in the last 168 hours.  CBC: Recent Labs  Lab 05/30/17 0536 06/02/17 0705 06/04/17 0635  WBC 11.2* 12.3* 11.8*  HGB 8.9* 9.5* 7.1*  HCT 26.0* 28.5* 22.1*  MCV 89.3 91.6 94.4  PLT 343 231 407*    Cardiac Enzymes: No results for input(s): CKTOTAL, CKMB, CKMBINDEX, TROPONINI in the last 168 hours.  BNP: Invalid input(s): POCBNP  CBG: No results for input(s): GLUCAP in the last 168 hours.  Microbiology: Results for orders placed or performed during the hospital encounter of 05/15/17  C difficile quick scan w PCR reflex     Status: None   Collection Time: 05/17/17 12:30 PM  Result Value Ref Range Status   C Diff antigen NEGATIVE NEGATIVE Final   C Diff toxin NEGATIVE NEGATIVE Final   C Diff interpretation No C. difficile detected.  Final    Coagulation Studies: No results for  input(s): LABPROT, INR in the last 72 hours.  Urinalysis: No results for input(s): COLORURINE, LABSPEC, PHURINE, GLUCOSEU, HGBUR, BILIRUBINUR, KETONESUR, PROTEINUR, UROBILINOGEN, NITRITE, LEUKOCYTESUR in the last 72 hours.  Invalid input(s): APPERANCEUR    Imaging: No results found.   Medications:       Assessment/ Plan:  57 y.o. male with a PMHx of diabetes mellitus type 2, schizophrenia, bipolar disorder, hyperlipidemia, hypertension, who was admitted to [facility] on 05/15/2017 for ongoing treatment of acute respiratory failure secondary to bilateral pneumonia as well as acute renal failure requiring hemodialysis.  He had witnessed cardiopulmonary arrest prior to being brought to outside hospital.  1.  Acute renal failure secondary to septic shock. 2.  Acute respiratory failure s/p tracheostomy 3.  Anemia unspecified. 4.  Hypotension.  Plan:  Right internal jugular PermCath appears to be functioning quite well.  We will maintain the patient on dialysis on Monday, Wednesday, Friday.  His creatinine has been trending lower recently.  He is maintained on a 4K bath at the moment.  Continue to monitor renal function trend daily.   LOS: 0 Nathan Branch 2/13/20195:06 PM

## 2017-06-06 ENCOUNTER — Other Ambulatory Visit (HOSPITAL_COMMUNITY): Payer: Medicaid Other

## 2017-06-06 LAB — RENAL FUNCTION PANEL
ANION GAP: 10 (ref 5–15)
Albumin: 1.9 g/dL — ABNORMAL LOW (ref 3.5–5.0)
BUN: 24 mg/dL — ABNORMAL HIGH (ref 6–20)
CALCIUM: 8.7 mg/dL — AB (ref 8.9–10.3)
CO2: 28 mmol/L (ref 22–32)
Chloride: 103 mmol/L (ref 101–111)
Creatinine, Ser: 2.37 mg/dL — ABNORMAL HIGH (ref 0.61–1.24)
GFR, EST AFRICAN AMERICAN: 34 mL/min — AB (ref >60)
GFR, EST NON AFRICAN AMERICAN: 29 mL/min — AB (ref >60)
Glucose, Bld: 57 mg/dL — ABNORMAL LOW (ref 65–99)
PHOSPHORUS: 2.6 mg/dL (ref 2.5–4.6)
POTASSIUM: 3.4 mmol/L — AB (ref 3.5–5.1)
Sodium: 141 mmol/L (ref 135–145)

## 2017-06-06 LAB — CBC
HEMATOCRIT: 23.1 % — AB (ref 39.0–52.0)
HEMOGLOBIN: 7.3 g/dL — AB (ref 13.0–17.0)
MCH: 30.2 pg (ref 26.0–34.0)
MCHC: 31.6 g/dL (ref 30.0–36.0)
MCV: 95.5 fL (ref 78.0–100.0)
Platelets: 387 10*3/uL (ref 150–400)
RBC: 2.42 MIL/uL — AB (ref 4.22–5.81)
RDW: 15.5 % (ref 11.5–15.5)
WBC: 14.1 10*3/uL — AB (ref 4.0–10.5)

## 2017-06-06 NOTE — Progress Notes (Signed)
  [facility]  ROUNDING NOTE   Subjective:  Patient much more awake and alert today.  He does not yes/no to questions. He will be due for hemodialysis today.   Objective:  Vital signs in last 24 hours:  Temperature 98.9 pulse 106 respirations 20 blood pressure 157/80  Physical Exam: General: Critically ill appearing  Head: OM moist  Eyes: Anicteric  Neck: Tracheostomy in place  Lungs:  Scattered rhonchi, on ventilator  Heart: S1S2 no rubs  Abdomen:  Soft, nontender, bowel sounds present  Extremities: Trace peripheral edema.  Neurologic: Awake alert follows commands  Skin: No lesions  Access: Right IJ permcath    Basic Metabolic Panel: Recent Labs  Lab 06/02/17 0945 06/04/17 0635 06/06/17 0721  NA 133* 138 141  K 3.1* 3.0* 3.4*  CL 95* 98* 103  CO2 26 28 28   GLUCOSE 71 85 57*  BUN 45* 31* 24*  CREATININE 4.85* 3.43* 2.37*  CALCIUM 8.4* 8.4* 8.7*  PHOS 4.9* 3.4 2.6    Liver Function Tests: Recent Labs  Lab 06/02/17 0945 06/04/17 0635 06/06/17 0721  ALBUMIN 1.5* 1.6* 1.9*   No results for input(s): LIPASE, AMYLASE in the last 168 hours. No results for input(s): AMMONIA in the last 168 hours.  CBC: Recent Labs  Lab 06/02/17 0705 06/04/17 0635 06/06/17 0721  WBC 12.3* 11.8* 14.1*  HGB 9.5* 7.1* 7.3*  HCT 28.5* 22.1* 23.1*  MCV 91.6 94.4 95.5  PLT 231 407* 387    Cardiac Enzymes: No results for input(s): CKTOTAL, CKMB, CKMBINDEX, TROPONINI in the last 168 hours.  BNP: Invalid input(s): POCBNP  CBG: No results for input(s): GLUCAP in the last 168 hours.  Microbiology: Results for orders placed or performed during the hospital encounter of 05/15/17  C difficile quick scan w PCR reflex     Status: None   Collection Time: 05/17/17 12:30 PM  Result Value Ref Range Status   C Diff antigen NEGATIVE NEGATIVE Final   C Diff toxin NEGATIVE NEGATIVE Final   C Diff interpretation No C. difficile detected.  Final    Coagulation  Studies: No results for input(s): LABPROT, INR in the last 72 hours.  Urinalysis: No results for input(s): COLORURINE, LABSPEC, PHURINE, GLUCOSEU, HGBUR, BILIRUBINUR, KETONESUR, PROTEINUR, UROBILINOGEN, NITRITE, LEUKOCYTESUR in the last 72 hours.  Invalid input(s): APPERANCEUR    Imaging: No results found.   Medications:       Assessment/ Plan:  57 y.o. male with a PMHx of diabetes mellitus type 2, schizophrenia, bipolar disorder, hyperlipidemia, hypertension, who was admitted to [facility] on 05/15/2017 for ongoing treatment of acute respiratory failure secondary to bilateral pneumonia as well as acute renal failure requiring hemodialysis.  He had witnessed cardiopulmonary arrest prior to being brought to outside hospital.  1.  Acute renal failure secondary to septic shock. 2.  Acute respiratory failure s/p tracheostomy 3.  Anemia unspecified. 4.  Hypotension.  Plan:  Patient seen and evaluated at bedside.  He is much more awake and alert today.  He does follow simple commands and nods yes/no to questions.  He will be due for dialysis again today.  Orders have been prepared.  Thereafter we will plan for dialysis again on Monday.   LOS: 0 Galit Urich 2/15/20199:21 AM

## 2017-06-07 LAB — CBC
HEMATOCRIT: 24.8 % — AB (ref 39.0–52.0)
Hemoglobin: 8.2 g/dL — ABNORMAL LOW (ref 13.0–17.0)
MCH: 31.3 pg (ref 26.0–34.0)
MCHC: 33.1 g/dL (ref 30.0–36.0)
MCV: 94.7 fL (ref 78.0–100.0)
Platelets: 387 10*3/uL (ref 150–400)
RBC: 2.62 MIL/uL — ABNORMAL LOW (ref 4.22–5.81)
RDW: 15.2 % (ref 11.5–15.5)
WBC: 12.4 10*3/uL — ABNORMAL HIGH (ref 4.0–10.5)

## 2017-06-07 LAB — RENAL FUNCTION PANEL
Albumin: 1.9 g/dL — ABNORMAL LOW (ref 3.5–5.0)
Anion gap: 12 (ref 5–15)
BUN: 9 mg/dL (ref 6–20)
CHLORIDE: 98 mmol/L — AB (ref 101–111)
CO2: 27 mmol/L (ref 22–32)
Calcium: 8.4 mg/dL — ABNORMAL LOW (ref 8.9–10.3)
Creatinine, Ser: 1.4 mg/dL — ABNORMAL HIGH (ref 0.61–1.24)
GFR calc Af Amer: >60 mL/min (ref >60)
GFR calc non Af Amer: 56 mL/min — ABNORMAL LOW (ref >60)
GLUCOSE: 58 mg/dL — AB (ref 65–99)
POTASSIUM: 3.4 mmol/L — AB (ref 3.5–5.1)
Phosphorus: 2 mg/dL — ABNORMAL LOW (ref 2.5–4.6)
Sodium: 137 mmol/L (ref 135–145)

## 2017-06-09 LAB — CBC
HEMATOCRIT: 21.5 % — AB (ref 39.0–52.0)
Hemoglobin: 6.9 g/dL — CL (ref 13.0–17.0)
MCH: 30.4 pg (ref 26.0–34.0)
MCHC: 32.1 g/dL (ref 30.0–36.0)
MCV: 94.7 fL (ref 78.0–100.0)
PLATELETS: 413 10*3/uL — AB (ref 150–400)
RBC: 2.27 MIL/uL — AB (ref 4.22–5.81)
RDW: 15.9 % — ABNORMAL HIGH (ref 11.5–15.5)
WBC: 14.7 10*3/uL — AB (ref 4.0–10.5)

## 2017-06-09 LAB — RENAL FUNCTION PANEL
ALBUMIN: 2.1 g/dL — AB (ref 3.5–5.0)
Anion gap: 12 (ref 5–15)
BUN: 32 mg/dL — AB (ref 6–20)
CHLORIDE: 103 mmol/L (ref 101–111)
CO2: 28 mmol/L (ref 22–32)
CREATININE: 2.11 mg/dL — AB (ref 0.61–1.24)
Calcium: 8.7 mg/dL — ABNORMAL LOW (ref 8.9–10.3)
GFR, EST AFRICAN AMERICAN: 39 mL/min — AB (ref >60)
GFR, EST NON AFRICAN AMERICAN: 34 mL/min — AB (ref >60)
Glucose, Bld: 72 mg/dL (ref 65–99)
PHOSPHORUS: 4 mg/dL (ref 2.5–4.6)
POTASSIUM: 3.2 mmol/L — AB (ref 3.5–5.1)
Sodium: 143 mmol/L (ref 135–145)

## 2017-06-09 LAB — PREPARE RBC (CROSSMATCH)

## 2017-06-09 NOTE — Progress Notes (Signed)
  [facility]  ROUNDING NOTE   Subjective:  Patient able to verbalize. His tracheostomy is capped at the moment. His renal function is improved significantly and creatinine down to 2.1. Good urine output also noted.   Objective:  Vital signs in last 24 hours:  Temperature 97.9 pulse 104 respirations 20 blood pressure 162/67  Physical Exam: General: No acute distress  Head: OM moist hearing intact  Eyes: Anicteric  Neck: Tracheostomy in place  Lungs:  Scattered rhonchi, normal effort  Heart: S1S2 no rubs  Abdomen:  Soft, nontender, bowel sounds present  Extremities: Trace peripheral edema.  Neurologic: Awake alert follows commands  Skin: No lesions  Access: Right IJ permcath    Basic Metabolic Panel: Recent Labs  Lab 06/04/17 0635 06/06/17 0721 06/07/17 0632 06/09/17 0651  NA 138 141 137 143  K 3.0* 3.4* 3.4* 3.2*  CL 98* 103 98* 103  CO2 28 28 27 28   GLUCOSE 85 57* 58* 72  BUN 31* 24* 9 32*  CREATININE 3.43* 2.37* 1.40* 2.11*  CALCIUM 8.4* 8.7* 8.4* 8.7*  PHOS 3.4 2.6 2.0* 4.0    Liver Function Tests: Recent Labs  Lab 06/04/17 0635 06/06/17 0721 06/07/17 0632 06/09/17 0651  ALBUMIN 1.6* 1.9* 1.9* 2.1*   No results for input(s): LIPASE, AMYLASE in the last 168 hours. No results for input(s): AMMONIA in the last 168 hours.  CBC: Recent Labs  Lab 06/04/17 0635 06/06/17 0721 06/07/17 0632 06/09/17 0651  WBC 11.8* 14.1* 12.4* 14.7*  HGB 7.1* 7.3* 8.2* 6.9*  HCT 22.1* 23.1* 24.8* 21.5*  MCV 94.4 95.5 94.7 94.7  PLT 407* 387 387 413*    Cardiac Enzymes: No results for input(s): CKTOTAL, CKMB, CKMBINDEX, TROPONINI in the last 168 hours.  BNP: Invalid input(s): POCBNP  CBG: No results for input(s): GLUCAP in the last 168 hours.  Microbiology: Results for orders placed or performed during the hospital encounter of 05/15/17  C difficile quick scan w PCR reflex     Status: None   Collection Time: 05/17/17 12:30 PM  Result Value Ref  Range Status   C Diff antigen NEGATIVE NEGATIVE Final   C Diff toxin NEGATIVE NEGATIVE Final   C Diff interpretation No C. difficile detected.  Final    Coagulation Studies: No results for input(s): LABPROT, INR in the last 72 hours.  Urinalysis: No results for input(s): COLORURINE, LABSPEC, PHURINE, GLUCOSEU, HGBUR, BILIRUBINUR, KETONESUR, PROTEINUR, UROBILINOGEN, NITRITE, LEUKOCYTESUR in the last 72 hours.  Invalid input(s): APPERANCEUR    Imaging: No results found.   Medications:       Assessment/ Plan:  57 y.o. male with a PMHx of diabetes mellitus type 2, schizophrenia, bipolar disorder, hyperlipidemia, hypertension, who was admitted to [facility] on 05/15/2017 for ongoing treatment of acute respiratory failure secondary to bilateral pneumonia as well as acute renal failure requiring hemodialysis.  He had witnessed cardiopulmonary arrest prior to being brought to outside hospital.  1.  Acute renal failure secondary to septic shock. 2.  Acute respiratory failure s/p tracheostomy 3.  Anemia unspecified. 4.  Hypotension.  Plan:  Patient has had significant improvement in his renal function.  Creatinine down to 2.1.  Good urine output also noted.  If renal function remains good over the week we will consider discontinuing the PermCath later this week.  Otherwise continue supportive care.   LOS: 0 Yasmene Salomone 2/18/20194:07 PM

## 2017-06-10 LAB — BPAM RBC
BLOOD PRODUCT EXPIRATION DATE: 201902252359
ISSUE DATE / TIME: 201902181720
Unit Type and Rh: 5100

## 2017-06-10 LAB — PTH, INTACT AND CALCIUM
CALCIUM TOTAL (PTH): 8.9 mg/dL (ref 8.7–10.2)
PTH: 51 pg/mL (ref 15–65)

## 2017-06-10 LAB — TYPE AND SCREEN
ABO/RH(D): A POS
Antibody Screen: NEGATIVE
UNIT DIVISION: 0

## 2017-06-10 LAB — POTASSIUM: Potassium: 3 mmol/L — ABNORMAL LOW (ref 3.5–5.1)

## 2017-06-11 LAB — CBC
HEMATOCRIT: 30.4 % — AB (ref 39.0–52.0)
HEMOGLOBIN: 10 g/dL — AB (ref 13.0–17.0)
MCH: 29.9 pg (ref 26.0–34.0)
MCHC: 32.9 g/dL (ref 30.0–36.0)
MCV: 91 fL (ref 78.0–100.0)
Platelets: 442 10*3/uL — ABNORMAL HIGH (ref 150–400)
RBC: 3.34 MIL/uL — ABNORMAL LOW (ref 4.22–5.81)
RDW: 17.1 % — ABNORMAL HIGH (ref 11.5–15.5)
WBC: 18.9 10*3/uL — ABNORMAL HIGH (ref 4.0–10.5)

## 2017-06-11 LAB — POTASSIUM: POTASSIUM: 3.9 mmol/L (ref 3.5–5.1)

## 2017-06-11 NOTE — Progress Notes (Signed)
[facility]  ROUNDING NOTE   Subjective:  Patient in good spirits. No new creatinine today.   Objective:  Vital signs in last 24 hours:  Temperature 97.3 pulse 101 respirations 15 blood pressure 166/83  Physical Exam: General: No acute distress  Head: OM moist hearing intact  Eyes: Anicteric  Neck: Tracheostomy in place  Lungs:  Scattered rhonchi, normal effort  Heart: S1S2 no rubs  Abdomen:  Soft, nontender, bowel sounds present  Extremities: Trace peripheral edema.  Neurologic: Awake alert follows commands  Skin: No lesions  Access: Right IJ permcath    Basic Metabolic Panel: Recent Labs  Lab 06/06/17 0721 06/07/17 0632 06/09/17 0651 06/10/17 0617  NA 141 137 143  --   K 3.4* 3.4* 3.2* 3.0*  CL 103 98* 103  --   CO2 28 27 28   --   GLUCOSE 57* 58* 72  --   BUN 24* 9 32*  --   CREATININE 2.37* 1.40* 2.11*  --   CALCIUM 8.7* 8.4* 8.7*  8.9  --   PHOS 2.6 2.0* 4.0  --     Liver Function Tests: Recent Labs  Lab 06/06/17 0721 06/07/17 0632 06/09/17 0651  ALBUMIN 1.9* 1.9* 2.1*   No results for input(s): LIPASE, AMYLASE in the last 168 hours. No results for input(s): AMMONIA in the last 168 hours.  CBC: Recent Labs  Lab 06/06/17 0721 06/07/17 0632 06/09/17 0651 06/11/17 0759  WBC 14.1* 12.4* 14.7* 18.9*  HGB 7.3* 8.2* 6.9* 10.0*  HCT 23.1* 24.8* 21.5* 30.4*  MCV 95.5 94.7 94.7 91.0  PLT 387 387 413* 442*    Cardiac Enzymes: No results for input(s): CKTOTAL, CKMB, CKMBINDEX, TROPONINI in the last 168 hours.  BNP: Invalid input(s): POCBNP  CBG: No results for input(s): GLUCAP in the last 168 hours.  Microbiology: Results for orders placed or performed during the hospital encounter of 05/15/17  C difficile quick scan w PCR reflex     Status: None   Collection Time: 05/17/17 12:30 PM  Result Value Ref Range Status   C Diff antigen NEGATIVE NEGATIVE Final   C Diff toxin NEGATIVE NEGATIVE Final   C Diff interpretation No C.  difficile detected.  Final  Culture, blood (single)     Status: None (Preliminary result)   Collection Time: 06/09/17  1:35 PM  Result Value Ref Range Status   Specimen Description BLOOD RIGHT ARM  Final   Special Requests   Final    BOTTLES DRAWN AEROBIC AND ANAEROBIC Blood Culture adequate volume   Culture   Final    NO GROWTH 2 DAYS Performed at [facility], [address] [place], [place] [address]    Report Status PENDING  Incomplete    Coagulation Studies: No results for input(s): LABPROT, INR in the last 72 hours.  Urinalysis: No results for input(s): COLORURINE, LABSPEC, PHURINE, GLUCOSEU, HGBUR, BILIRUBINUR, KETONESUR, PROTEINUR, UROBILINOGEN, NITRITE, LEUKOCYTESUR in the last 72 hours.  Invalid input(s): APPERANCEUR    Imaging: No results found.   Medications:       Assessment/ Plan:  57 y.o. male with a PMHx of diabetes mellitus type 2, schizophrenia, bipolar disorder, hyperlipidemia, hypertension, who was admitted to [facility] on 05/15/2017 for ongoing treatment of acute respiratory failure secondary to bilateral pneumonia as well as acute renal failure requiring hemodialysis.  He had witnessed cardiopulmonary arrest prior to being brought to outside hospital.  1.  Acute renal failure secondary to septic shock. 2.  Acute respiratory  failure s/p tracheostomy 3.  Anemia unspecified. 4.  Hypotension.  Plan:  Recommend follow-up of renal function at least every other day to follow his BUN and creatinine trend.  Overall the patient has significantly improved as compared to last week.  No further dialysis needed at this time.  If renal function continues to improve would recommend consideration of PermCath removal on Friday or early next week.  LOS: 0 Charlita Brian 2/20/20194:06 PM

## 2017-06-13 ENCOUNTER — Other Ambulatory Visit (HOSPITAL_COMMUNITY): Payer: Medicaid Other

## 2017-06-13 ENCOUNTER — Encounter (HOSPITAL_COMMUNITY): Payer: Self-pay | Admitting: General Surgery

## 2017-06-13 HISTORY — PX: IR REMOVAL TUN CV CATH W/O FL: IMG2289

## 2017-06-13 NOTE — Progress Notes (Signed)
[facility]  ROUNDING NOTE   Subjective:  Patient awake, alert, and conversant this a.m. Urine output was 1.9 L over the preceding 24 hours. Hemoglobin up to 10.0 posttransfusion.    Objective:  Vital signs in last 24 hours:  Temperature 97.6 pulse 103 respirations 20 blood pressure 163/94  Physical Exam: General: No acute distress  Head: OM moist hearing intact  Eyes: Anicteric  Neck: Tracheostomy in place  Lungs:  Scattered rhonchi, normal effort  Heart: S1S2 no rubs  Abdomen:  Soft, nontender, bowel sounds present  Extremities: Trace peripheral edema.  Neurologic: Awake alert follows commands  Skin: No lesions  Access: Right IJ permcath    Basic Metabolic Panel: Recent Labs  Lab 06/07/17 0632 06/09/17 0651 06/10/17 0617 06/11/17 1501  NA 137 143  --   --   K 3.4* 3.2* 3.0* 3.9  CL 98* 103  --   --   CO2 27 28  --   --   GLUCOSE 58* 72  --   --   BUN 9 32*  --   --   CREATININE 1.40* 2.11*  --   --   CALCIUM 8.4* 8.7*  8.9  --   --   PHOS 2.0* 4.0  --   --     Liver Function Tests: Recent Labs  Lab 06/07/17 0632 06/09/17 0651  ALBUMIN 1.9* 2.1*   No results for input(s): LIPASE, AMYLASE in the last 168 hours. No results for input(s): AMMONIA in the last 168 hours.  CBC: Recent Labs  Lab 06/07/17 0632 06/09/17 0651 06/11/17 0759  WBC 12.4* 14.7* 18.9*  HGB 8.2* 6.9* 10.0*  HCT 24.8* 21.5* 30.4*  MCV 94.7 94.7 91.0  PLT 387 413* 442*    Cardiac Enzymes: No results for input(s): CKTOTAL, CKMB, CKMBINDEX, TROPONINI in the last 168 hours.  BNP: Invalid input(s): POCBNP  CBG: No results for input(s): GLUCAP in the last 168 hours.  Microbiology: Results for orders placed or performed during the hospital encounter of 05/15/17  C difficile quick scan w PCR reflex     Status: None   Collection Time: 05/17/17 12:30 PM  Result Value Ref Range Status   C Diff antigen NEGATIVE NEGATIVE Final   C Diff toxin NEGATIVE NEGATIVE Final    C Diff interpretation No C. difficile detected.  Final  Culture, blood (single)     Status: None (Preliminary result)   Collection Time: 06/09/17  1:35 PM  Result Value Ref Range Status   Specimen Description BLOOD RIGHT ARM  Final   Special Requests   Final    BOTTLES DRAWN AEROBIC AND ANAEROBIC Blood Culture adequate volume   Culture   Final    NO GROWTH 3 DAYS Performed at [facility], [address] [place], [place] [address]    Report Status PENDING  Incomplete    Coagulation Studies: No results for input(s): LABPROT, INR in the last 72 hours.  Urinalysis: No results for input(s): COLORURINE, LABSPEC, PHURINE, GLUCOSEU, HGBUR, BILIRUBINUR, KETONESUR, PROTEINUR, UROBILINOGEN, NITRITE, LEUKOCYTESUR in the last 72 hours.  Invalid input(s): APPERANCEUR    Imaging: No results found.   Medications:       Assessment/ Plan:  57 y.o. male with a PMHx of diabetes mellitus type 2, schizophrenia, bipolar disorder, hyperlipidemia, hypertension, who was admitted to [facility] on 05/15/2017 for ongoing treatment of acute respiratory failure secondary to bilateral pneumonia as well as acute renal failure requiring hemodialysis.  He had witnessed cardiopulmonary arrest  prior to being brought to outside hospital.  1.  Acute renal failure secondary to septic shock. 2.  Acute respiratory failure s/p tracheostomy 3.  Anemia unspecified. 4.  Hypotension.  Plan:  Good urine output noted.  No new creatinine today.  We will go ahead and order removal of PermCath.  Reorder renal function parameters as well.  Hemoglobin up to 10.0 posttransfusion.  Continue to monitor CBC.  Overall significant improvement noted from admission.  LOS: 0 Nathan Branch 2/22/20197:53 AM

## 2017-06-14 LAB — BASIC METABOLIC PANEL
ANION GAP: 13 (ref 5–15)
BUN: 44 mg/dL — ABNORMAL HIGH (ref 6–20)
CHLORIDE: 104 mmol/L (ref 101–111)
CO2: 23 mmol/L (ref 22–32)
Calcium: 8.8 mg/dL — ABNORMAL LOW (ref 8.9–10.3)
Creatinine, Ser: 1.6 mg/dL — ABNORMAL HIGH (ref 0.61–1.24)
GFR calc non Af Amer: 47 mL/min — ABNORMAL LOW (ref >60)
GFR, EST AFRICAN AMERICAN: 55 mL/min — AB (ref >60)
Glucose, Bld: 125 mg/dL — ABNORMAL HIGH (ref 65–99)
POTASSIUM: 3.4 mmol/L — AB (ref 3.5–5.1)
Sodium: 140 mmol/L (ref 135–145)

## 2017-06-14 LAB — CBC
HEMATOCRIT: 26.6 % — AB (ref 39.0–52.0)
HEMOGLOBIN: 8.5 g/dL — AB (ref 13.0–17.0)
MCH: 29.9 pg (ref 26.0–34.0)
MCHC: 32 g/dL (ref 30.0–36.0)
MCV: 93.7 fL (ref 78.0–100.0)
Platelets: 420 10*3/uL — ABNORMAL HIGH (ref 150–400)
RBC: 2.84 MIL/uL — AB (ref 4.22–5.81)
RDW: 16.7 % — ABNORMAL HIGH (ref 11.5–15.5)
WBC: 14.2 10*3/uL — ABNORMAL HIGH (ref 4.0–10.5)

## 2017-06-14 LAB — CULTURE, BLOOD (SINGLE)
Culture: NO GROWTH
Special Requests: ADEQUATE

## 2017-06-21 LAB — BASIC METABOLIC PANEL
ANION GAP: 10 (ref 5–15)
BUN: 40 mg/dL — ABNORMAL HIGH (ref 6–20)
CHLORIDE: 102 mmol/L (ref 101–111)
CO2: 24 mmol/L (ref 22–32)
Calcium: 8.9 mg/dL (ref 8.9–10.3)
Creatinine, Ser: 1.22 mg/dL (ref 0.61–1.24)
GFR calc Af Amer: >60 mL/min (ref >60)
Glucose, Bld: 111 mg/dL — ABNORMAL HIGH (ref 65–99)
POTASSIUM: 3.8 mmol/L (ref 3.5–5.1)
Sodium: 136 mmol/L (ref 135–145)

## 2017-06-21 LAB — CBC
HEMATOCRIT: 25.7 % — AB (ref 39.0–52.0)
HEMOGLOBIN: 8 g/dL — AB (ref 13.0–17.0)
MCH: 29.7 pg (ref 26.0–34.0)
MCHC: 31.1 g/dL (ref 30.0–36.0)
MCV: 95.5 fL (ref 78.0–100.0)
Platelets: 438 10*3/uL — ABNORMAL HIGH (ref 150–400)
RBC: 2.69 MIL/uL — AB (ref 4.22–5.81)
RDW: 17.9 % — ABNORMAL HIGH (ref 11.5–15.5)
WBC: 12.4 10*3/uL — AB (ref 4.0–10.5)

## 2017-06-23 LAB — BASIC METABOLIC PANEL
ANION GAP: 11 (ref 5–15)
BUN: 38 mg/dL — ABNORMAL HIGH (ref 6–20)
CO2: 24 mmol/L (ref 22–32)
Calcium: 9 mg/dL (ref 8.9–10.3)
Chloride: 99 mmol/L — ABNORMAL LOW (ref 101–111)
Creatinine, Ser: 1.14 mg/dL (ref 0.61–1.24)
GFR calc non Af Amer: >60 mL/min (ref >60)
GLUCOSE: 214 mg/dL — AB (ref 65–99)
POTASSIUM: 4 mmol/L (ref 3.5–5.1)
Sodium: 134 mmol/L — ABNORMAL LOW (ref 135–145)

## 2019-05-27 IMAGING — DX DG CHEST 1V PORT
1 series · 1 of 1 positions shown · non-contrast
Comparison: May 16, 2017

CLINICAL DATA: Bilateral pneumonia.

EXAM:
PORTABLE CHEST 1 VIEW

[chest ap]
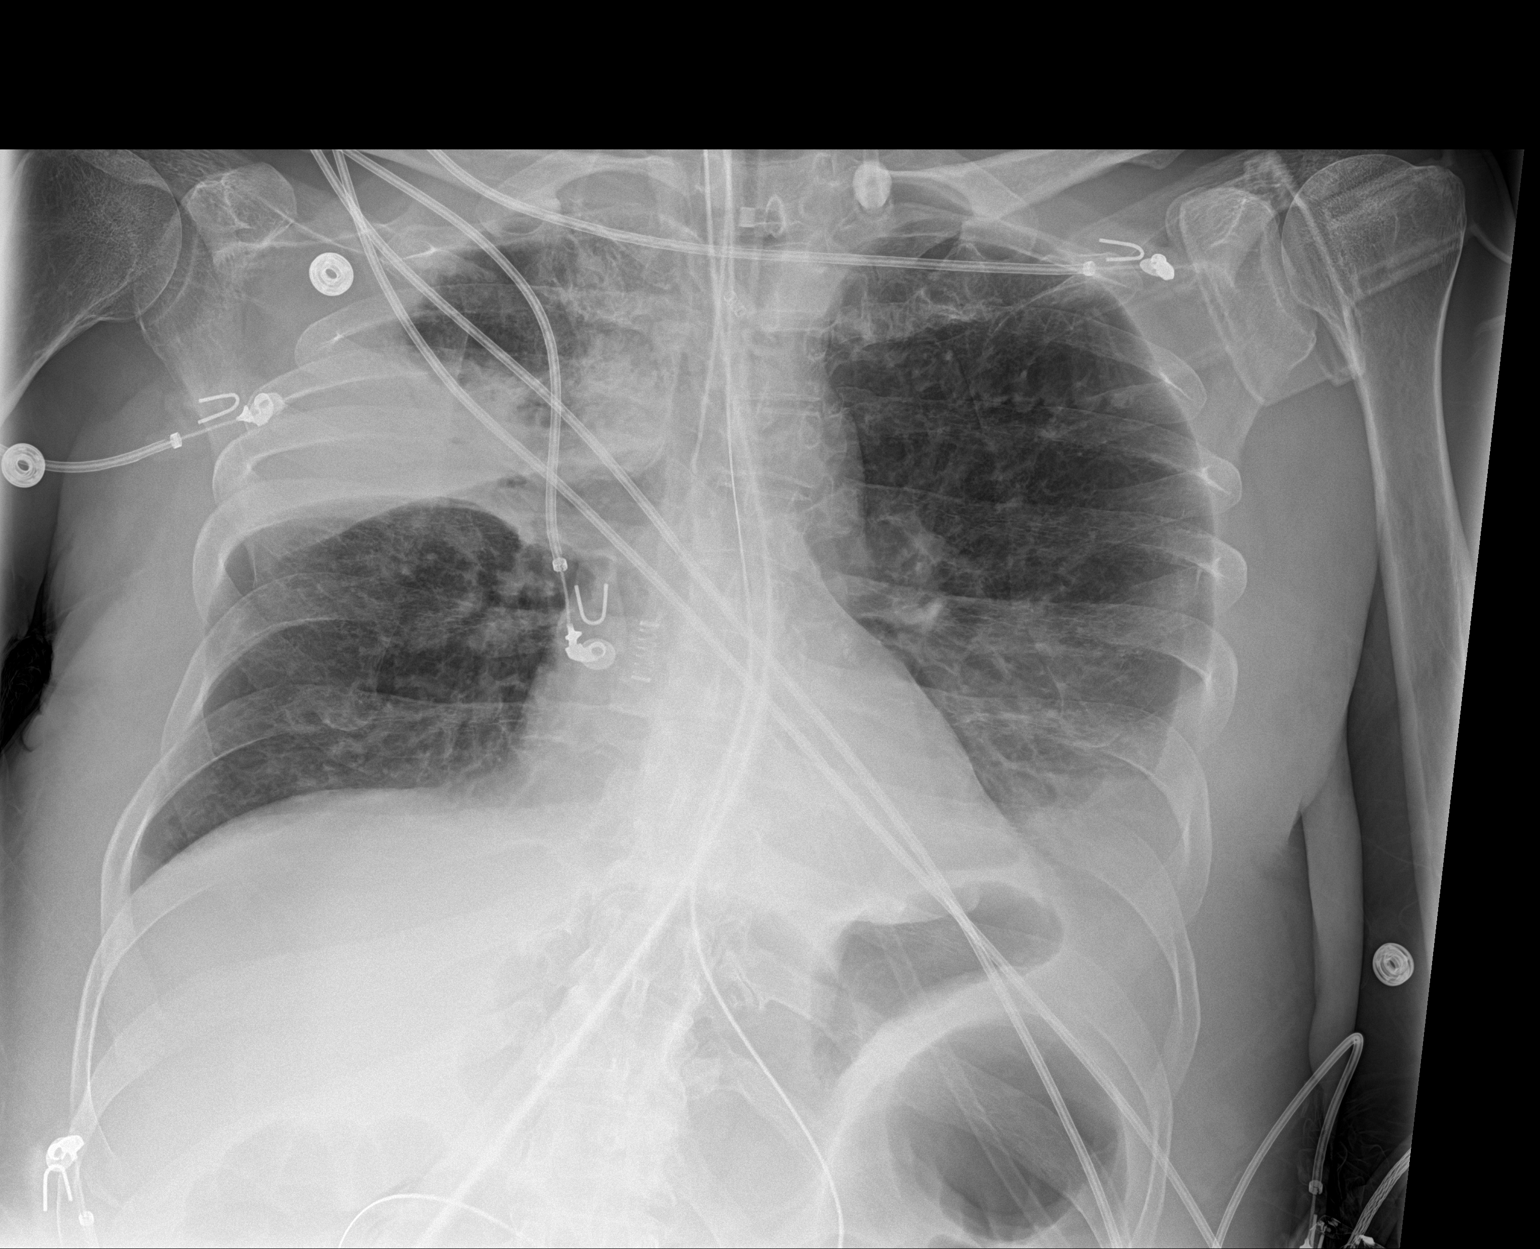

[1 of 1 positions shown; findings below may reference images not displayed]

FINDINGS: The ETT terminates at the carina. Recommend withdrawing 2 cm. The NG
tube terminates below today's film. No pneumothorax. Infiltrate in
the right upper lobe persists. It is more focal in the interval.
Effusion and opacity in the left base persists, similar in the
interval. No other interval changes.
IMPRESSION: 1. The ETT terminates near the carina.  Recommend withdrawing 2 cm.
2. Infiltrate in the right upper lobe persists but is more focal and
dense in appearance. Recommend follow-up to resolution.
3. Effusion and opacity in the left base is stable.
4. These results will be called to the ordering clinician or
representative by the Radiologist Assistant, and communication
documented in the PACS or zVision Dashboard.

## 2019-05-28 IMAGING — DX DG ABD PORTABLE 1V
1 series · 1 of 1 positions shown · non-contrast
Comparison: 05/16/2017, chest radiograph 05/24/2017

CLINICAL DATA: NG tube placement

EXAM:
PORTABLE ABDOMEN - 1 VIEW

[abdomen]
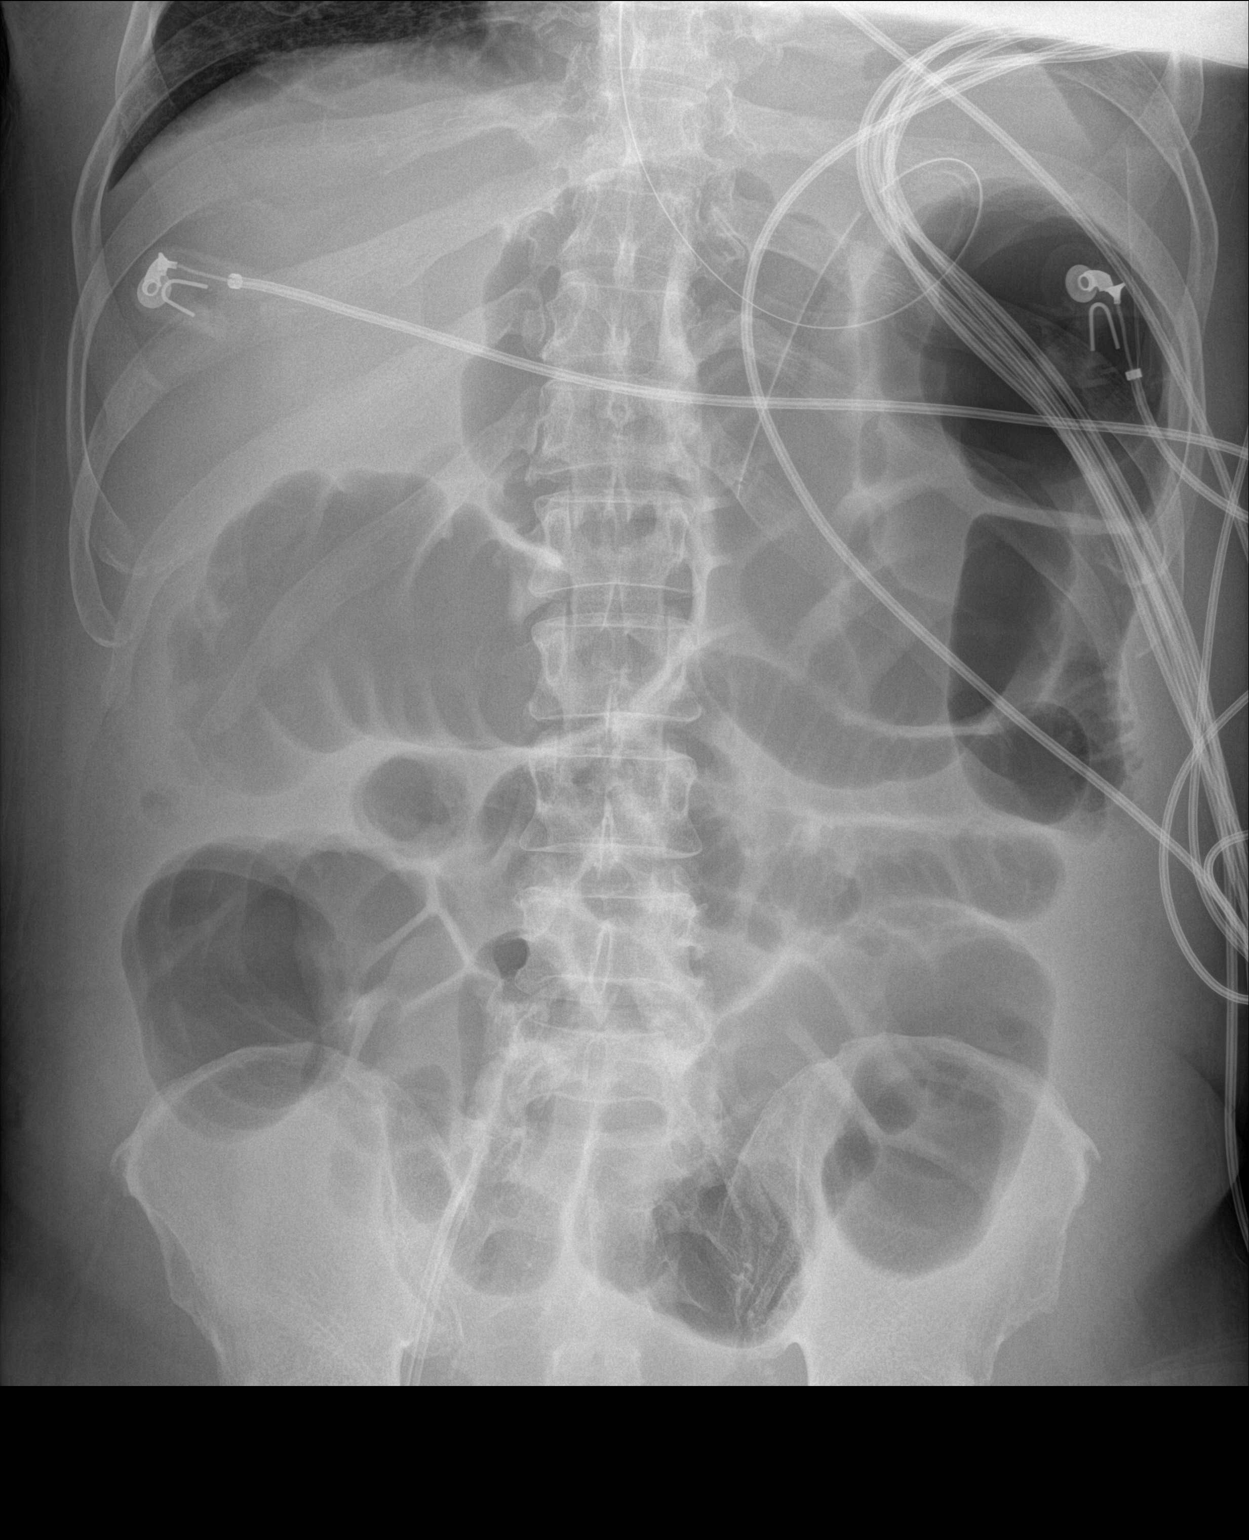

[1 of 1 positions shown; findings below may reference images not displayed]

FINDINGS: Esophageal tube is looped in the stomach, the tip projects over the
mid gastric region. Interval increase in gaseous dilatation of small
and large bowel. Right femoral catheter projects over the right
upper sacrum.
IMPRESSION: 1. Esophageal tube is looped within the stomach and the tip projects
over the mid gastric region
2. Interval increase in gaseous enlargement of small and large
bowel, findings could be secondary to adynamic ileus versus partial
obstruction.

## 2019-06-02 IMAGING — DX DG ABD PORTABLE 1V
1 series · 1 of 1 positions shown · non-contrast
Comparison: Radiograph May 26, 2017.

CLINICAL DATA: Nasogastric tube placement.

EXAM:
PORTABLE ABDOMEN - 1 VIEW

[abdomen kub]
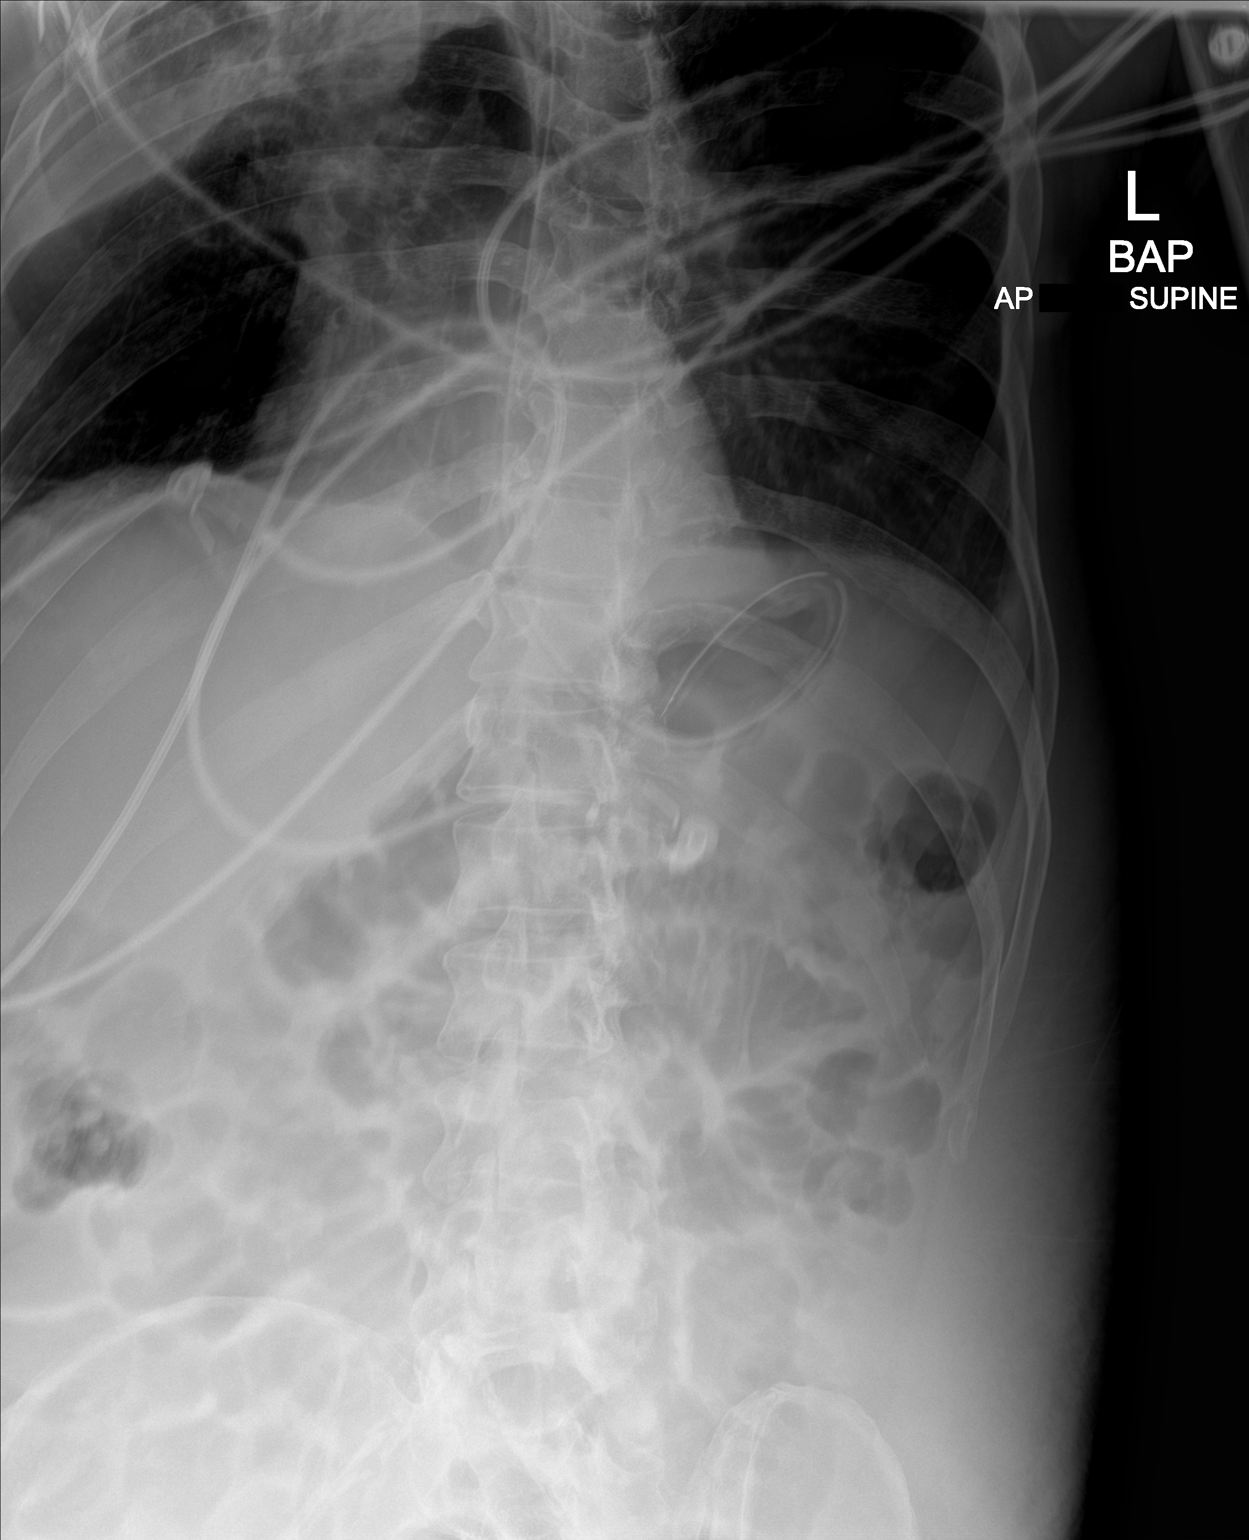

[1 of 1 positions shown; findings below may reference images not displayed]

FINDINGS: The bowel gas pattern is normal. No radio-opaque calculi or other
significant radiographic abnormality are seen. Distal tip of
nasogastric tube is seen in proximal stomach.
IMPRESSION: Distal tip of nasogastric tube seen in proximal stomach. No evidence
of bowel obstruction or ileus.

## 2019-06-05 IMAGING — XA IR US GUIDE VASC ACCESS RIGHT
1 series · 1 of 1 positions shown · non-contrast
Comparison: none

INDICATION: End-stage renal disease. In need of durable intravenous access for
the continuation hemodialysis.

[Series 1: single · 1 of 1 slices shown]
[im 1/1]
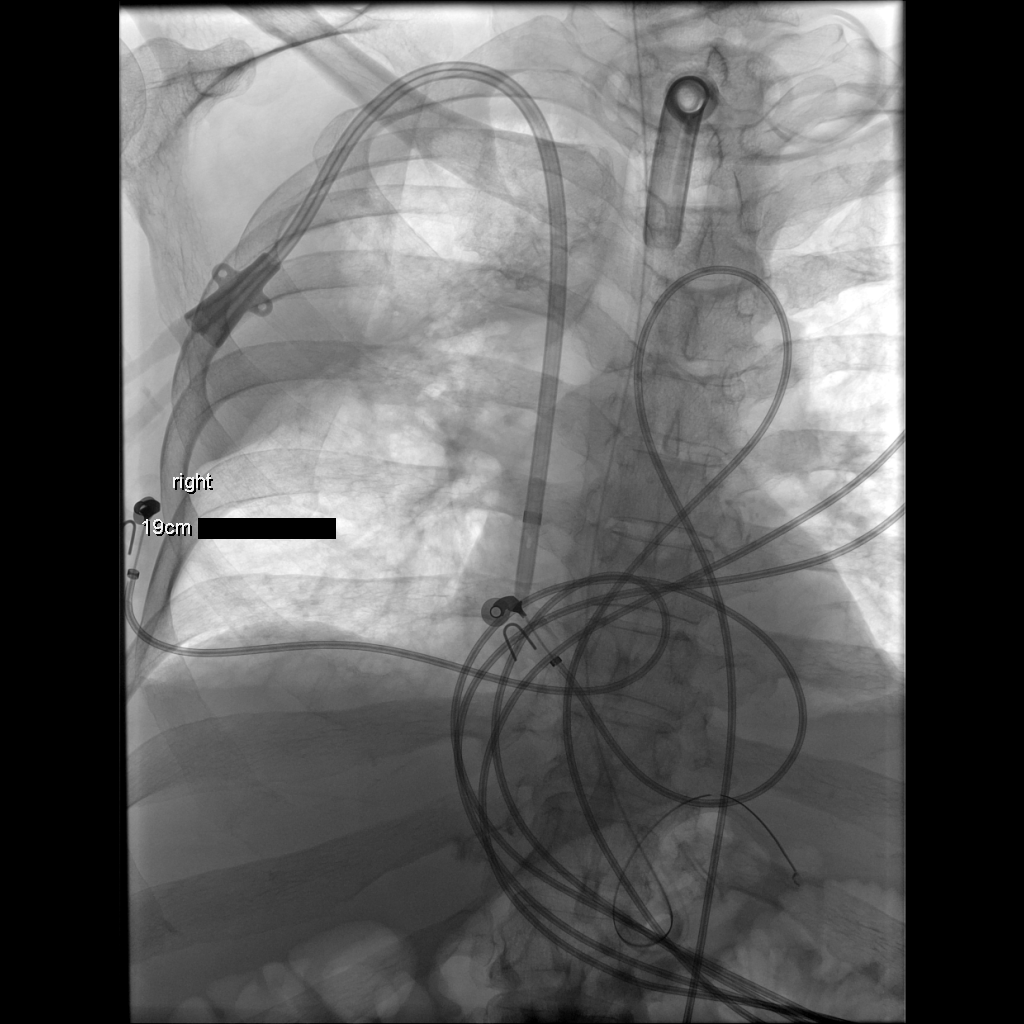

[1 of 1 positions shown; findings below may reference images not displayed]

EXAM:
TUNNELED CENTRAL VENOUS HEMODIALYSIS CATHETER PLACEMENT WITH
ULTRASOUND AND FLUOROSCOPIC GUIDANCE

MEDICATIONS:
Ancef 2 gm IV . The antibiotic was given in an appropriate time
interval prior to skin puncture.

ANESTHESIA/SEDATION:
Fentanyl 50 mcg IV;

Moderate Sedation Time:  13 minutes

The patient was continuously monitored during the procedure by the
interventional radiology nurse under my direct supervision.

FLUOROSCOPY TIME:  Fluoroscopy Time: 18 seconds (2 mGy).

COMPLICATIONS:
None immediate.



After creating a small venotomy incision, a micropuncture kit was
utilized to access the internal jugular vein. Real-time ultrasound
guidance was utilized for vascular access including the acquisition
of a permanent ultrasound image documenting patency of the accessed
vessel. The microwire was utilized to measure appropriate catheter
length.

A stiff Glidewire was advanced to the level of the IVC and the
micropuncture sheath was exchanged for a peel-away sheath. A
palindrome tunneled hemodialysis catheter measuring 19 cm from tip
to cuff was tunneled in a retrograde fashion from the anterior chest
wall to the venotomy incision.

The catheter was then placed through the peel-away sheath with tips
ultimately positioned within the superior aspect of the right
atrium. Final catheter positioning was confirmed and documented with
a spot radiographic image. The catheter aspirates and flushes
normally. The catheter was flushed with appropriate volume heparin
dwells.

The catheter exit site was secured with a 0-Prolene retention
suture. The venotomy incision was closed with an interrupted 4-0
Vicryl, Dermabond and Lohankethmin. Dressings were applied. The
patient tolerated the procedure well without immediate post
procedural complication.
IMPRESSION: Successful placement of 19 cm tip to cuff tunneled hemodialysis
catheter via the right internal jugular vein with tips terminating
within the superior aspect of the right atrium. The catheter is
ready for immediate use.
